# Patient Record
Sex: Male | Born: 1945 | Race: White | Hispanic: No | Smoking: Never smoker
Health system: Southern US, Community
[De-identification: ages and names within clinical notes are randomized; demographics above are authoritative.]

## PROBLEM LIST (undated history)

## (undated) DIAGNOSIS — E785 Hyperlipidemia, unspecified: Secondary | ICD-10-CM

## (undated) DIAGNOSIS — E039 Hypothyroidism, unspecified: Secondary | ICD-10-CM

## (undated) DIAGNOSIS — E0500 Thyrotoxicosis with diffuse goiter without thyrotoxic crisis or storm: Secondary | ICD-10-CM

## (undated) DIAGNOSIS — J45909 Unspecified asthma, uncomplicated: Secondary | ICD-10-CM

## (undated) DIAGNOSIS — K219 Gastro-esophageal reflux disease without esophagitis: Secondary | ICD-10-CM

## (undated) DIAGNOSIS — I10 Essential (primary) hypertension: Secondary | ICD-10-CM

## (undated) HISTORY — DX: Gastro-esophageal reflux disease without esophagitis: K21.9

## (undated) HISTORY — DX: Essential (primary) hypertension: I10

## (undated) HISTORY — DX: Unspecified asthma, uncomplicated: J45.909

## (undated) HISTORY — PX: CATARACT EXTRACTION: SUR2

## (undated) HISTORY — DX: Hyperlipidemia, unspecified: E78.5

## (undated) HISTORY — DX: Thyrotoxicosis with diffuse goiter without thyrotoxic crisis or storm: E05.00

---
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
## 2007-02-16 ENCOUNTER — Ambulatory Visit: Payer: Self-pay | Admitting: Cardiology

## 2007-02-23 ENCOUNTER — Ambulatory Visit (HOSPITAL_COMMUNITY): Admission: RE | Admit: 2007-02-23 | Discharge: 2007-02-23 | Payer: Self-pay | Admitting: Cardiology

## 2007-03-15 ENCOUNTER — Ambulatory Visit: Payer: Self-pay | Admitting: Cardiology

## 2007-06-03 ENCOUNTER — Ambulatory Visit: Payer: Self-pay | Admitting: Internal Medicine

## 2010-12-26 ENCOUNTER — Other Ambulatory Visit: Payer: Self-pay | Admitting: Otolaryngology

## 2010-12-26 DIAGNOSIS — R51 Headache: Secondary | ICD-10-CM

## 2010-12-27 ENCOUNTER — Ambulatory Visit
Admission: RE | Admit: 2010-12-27 | Discharge: 2010-12-27 | Disposition: A | Discharge status: Home / Self Care | Payer: MEDICARE | Source: Ambulatory Visit | Attending: Otolaryngology | Admitting: Otolaryngology

## 2010-12-27 DIAGNOSIS — R51 Headache: Secondary | ICD-10-CM

## 2010-12-27 MED ORDER — IOHEXOL 300 MG/ML  SOLN
75.0000 mL | Freq: Once | INTRAMUSCULAR | Status: AC | PRN
  Administered 2010-12-27: 75 mL via INTRAVENOUS

## 2011-03-25 NOTE — Assessment & Plan Note (Signed)
[facility]                             PULMONARY OFFICE NOTE   NAME:[name] Huff, [name] Huff                      MRN:          [id number]  DATE:06/03/2007                            DOB:          1946-04-01    PRIMARY CARE PHYSICIAN:  Dr. Brent Bulla.   PROBLEM LIST:  This gentleman seeks allergy evaluation because of  itching spots he associates with ant bites.   HISTORY:  This has been an issue over the past week and is now beginning  to improve as he treats it with an over-the-counter cream.  It is worse  in heat or direct sunlight.  Three weeks ago he says he was trying to  feed a cat in a loft and put his arm down where he saw some ants.  Apparently he got no more than a couple of ants on his left forearm but  he now points to isolated skin lesions which might be bites on his neck,  both arms, and legs.  He says initially there were little blisters,  especially at a place on the volar surface of his left elbow.  A similar  episode 2 years ago was treated with amoxicillin and prednisone.  He was  most concerned about one particular site on his left elbow where there  was a little serous drainage after he had scratched it.  He has been  using a hydrocortisone cream and a Benadryl cream.  Eyes and nose itch  some.  Remote history of allergy skin testing by Dr. Stevphen Rochester.   MEDICATIONS:  1. Lisinopril 20 mg.  2. Aspirin 81 mg.  3. Benadryl 2 daily.   No medication allergy.   REVIEW OF SYSTEMS:  Acid indigestion, sneezing, and itching.  A  chiropractor is using a vibrator for sinus congestion.  No wheeze or  cough.  No adenopathy.   PAST HISTORY:  1. Tonsillectomy.  2. Seasonal rhinitis.  3. He had a negative workup for chest pain ruling out MI in the past.  4. Elevated cholesterol.  5. No history of intolerance to latex, iodine, or aspirin.   SOCIAL HISTORY:  He is divorced.  No tobacco or alcohol.  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   FAMILY HISTORY:  Allergies.   OBJECTIVE:  VITAL SIGNS:  Weight  207 pounds, BP 136/78, pulse 78, room  air saturation 99%.  SKIN:  Isolated, small, nonspecific lesions consistent with insect  bites, possibly mosquitos, especially the ones on his arms and legs.  I  see no evidence of cellulitis.  There has been only limited excoriation.  I do not find adenopathy.  One lesion on the lateral aspect of his left  elbow was a little more indurated but still not impressive, about the  size of a quarter.  HEENT:  Oral mucosa is clear.  He has dentures.  Right tympanic membrane  is a little retracted.  CHEST:  Clear  HEART:  Sounds normal.   IMPRESSION:  1. Insect bites.  I cannot exclude a little bit of cellulitis/impetigo  associated with the one of his left elbow.  2. Eustachian dysfunction.  3. Seasonal allergic rhinitis.   PLAN:  1. Saline lavage.  2. Keflex prescription to hold 500 mg q.i.d. for 7 days.  3. Depo-Medrol 80 mg IM.  4. Return p.r.n.     Clinton D. Maple Hudson, MD, [facility], FACP  Electronically Signed    CDY/MedQ  DD: 06/05/2007  DT: 06/06/2007  Job #: [id number]

## 2011-03-28 NOTE — Consult Note (Signed)
[name] Huff, [name] Huff             ACCOUNT NO.:  [id number]   MEDICAL RECORD NO.:  [id number]          PATIENT TYPE:  OUT   LOCATION:  [facility]                         FACILITY:  [facility]   PHYSICIAN:  Noralyn Pick. Eden Emms, MD, FACCDATE OF BIRTH:  [birth year]   DATE OF CONSULTATION:  02/23/2007  DATE OF DISCHARGE:                                 CONSULTATION   EXERCISE TREADMILL TEST:  [name] Huff is a 65 year old patient of Dr.  Antoine Poche.  He has a history of hypertension.  Study was done to rule out  ischemia.  The patient is a school bus driver, apparently.   The patient exercised for 6 minutes and 30 seconds on the standard Bruce  protocol.  Exercise was stopped due to fatigue.  Maximum heart rate was  160.  Peak blood pressure was 195/93.  Resting EKG was normal.  With  stress, there was no ischemia.   IMPRESSION:  Negative exercise stress test with evidence for  deconditioning and a hypertensive response to exercise.   The patient's EKG was without evidence of ischemia or arrhythmia.   The patient will follow up with Dr. Antoine Poche in regards to possible  further treatment for his blood pressure.   He apparently is taking metoprolol 25 mg a day, but may benefit from the  addition of an ACE inhibitor.      Noralyn Pick. Eden Emms, MD, [facility]  Electronically Signed     PCN/MEDQ  D:  02/23/2007  T:  02/23/2007  Job:  [id number]

## 2011-03-28 NOTE — Assessment & Plan Note (Signed)
[facility]                            CARDIOLOGY OFFICE NOTE   NAME:[name] Huff, [name] Huff                      MRN:          [id number]  DATE:02/16/2007                            DOB:          [birth year]    PRIMARY CARE PHYSICIAN:  Dr. Brent Bulla.   REASON FOR PRESENTATION:  Evaluate patient with an abnormal EKG.   HISTORY OF PRESENT ILLNESS:  The patient is a very pleasant 65 year old  gentleman without prior cardiac history.  He recently saw Dr. Marina Goodell for  symptoms of sinus drainage and allergies.  He mentioned that he had been  having some chest discomfort.  Two to three days prior to this  presentation, he had been working in his yard.  He did a lot of heavy  lifting including posts and cutting a fallen tree.  With this, he did  not have any substernal chest pressure, neck or arm discomfort.  He may  have had a little nagging muscle soreness.  However, when he stopped  doing the work over the next couple of days, he noticed some chest  discomfort.  He points to his right substernal area.  It was mild.  It  was persistent over those 2-3 days.  There were no associated symptoms.  There was no nausea, vomiting or diaphoresis.  He never had anything  like this before.  He had no shortness of breath.  He has been  nauseated, but this has been a problem off and on and was not  particularly associated with the chest pain.  He had no excessive  diaphoresis with this discomfort, though he was sweating when he was  working hard.  He said the symptoms subsequently faded, and he has not  had them in about a week.  However, when he saw Dr. Marina Goodell, his EKG  demonstrated some inferior Q waves, suggestive of a possible old  inferior infarction.   The patient otherwise says he is doing well, except for the nausea which  bothers him.  He cannot reproducibly make himself have chest discomfort.  He does not have neck or arm discomfort with activity.  He has no  PND,  no orthopnea, no palpitations, no presyncope.   PAST MEDICAL HISTORY:  Hypertension x4 years.   PAST SURGICAL HISTORY:  Tonsillectomy.   ALLERGIES:  None.   CURRENT MEDICATIONS:  1. Tylenol.  2. Rhinocort.  3. Aspirin 81 mg daily.  4. Metoprolol 25 mg daily.  5. Loratadine.   SOCIAL HISTORY:  The patient used to smoke a few cigars but quit 30  years ago.  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  He is separated and has no children.   FAMILY HISTORY:  Noncontributory for early coronary artery disease.   REVIEW OF SYSTEMS:  As stated in the HPI.  Positive for occasional  headaches, wearing glasses, reflux, back discomfort with a protruding  disk, seasonal allergies.   PHYSICAL EXAMINATION:  GENERAL:  The patient is well appearing and in no  distress.  He is quite pleasant.  VITAL SIGNS:  Blood pressure  139/89, heart rate 76 and regular, weight  210 pounds, body mass index 32.  HEENT:  Eyelids unremarkable.  Pupils equal, round and reactive to  light.  Fundi within normal limits.  Oral mucosa unremarkable.  NECK:  No jugular venous distention at 45 degrees.  Carotid upstrokes  brisk and symmetric.  No bruits.  No thyromegaly.  LYMPHATICS:  No cervical, axillary or inguinal adenopathy.  LUNGS:  Clear to auscultation bilaterally.  BACK:  No costovertebral angle tenderness.  CHEST:  Unremarkable.  HEART:  PMI not displaced or sustained.  S1 and S2 within normal limits.  No S3, no S4, no clicks, no rubs, no murmurs.  ABDOMEN:  Obese.  Positive bowel sounds.  Normal in frequency and pitch.  No bruits, rebound or guarding.  No midline pulsatile mass.  No  hepatomegaly, no splenomegaly.  SKIN:  No rashes, no nodules.  EXTREMITIES:  There were 2+ pulses.  No edema, no cyanosis, no clubbing.  NEUROLOGIC:  Oriented to person, place, and time.  Cranial nerves II-XII  grossly intact.  Motor grossly intact.   ELECTROCARDIOGRAM:  Sinus rhythm, premature  atrial contractions.  Inferior Q waves not meeting criteria diagnostic of an inferior  myocardial infarction.  No acute ST-T wave changes.   ASSESSMENT AND PLAN:  1. The patient's chest discomfort is atypical.  His EKG does not      indicate a previous myocardial infarction.  He does have some risk      factors and is somewhat sedentary.  I think it is very reasonable      to screen him with an exercise treadmill test.  I think he has a      low pretest probability of obstructive coronary disease.  Most      importantly, we can risk stratify with this study, and also give      him a prescription for exercise.  The patient will come back to      have this done; the soonest we do it is in the hospital probably in      the next day or so.  If he has a normal exercise treadmill test, he      should be told that he can go back to driving a school bus.  He      wants to hold off until this study.  2. Obesity.  We discussed the need for weight loss.  He should do this      with diet and exercise.  I have recommended the [facility] Diet.  3. Hypertension.  Blood pressure seems to be controlled on the current      regimen, although the      ideal would be 120/70.  He may be able to accomplish this with      weight loss.  4. Follow up will be as needed, based on the results of his stress      test.     Rollene Rotunda, MD, [facility]  Electronically Signed    JH/MedQ  DD: 02/16/2007  DT: 02/17/2007  Job #: [id number]   cc:   Brent Bulla, M.D.

## 2011-03-28 NOTE — Assessment & Plan Note (Signed)
[facility]                            CARDIOLOGY OFFICE NOTE   NAME:[name], Huff                      MRN:          [id number]  DATE:03/15/2007                            DOB:          03/01/1946    PRIMARY CARE PHYSICIAN:  Dr. Brent Bulla.   REASON FOR PRESENTATION:  Evaluate patient with an abnormal EKG.   HISTORY OF PRESENT ILLNESS:  Patient presents for followup.  He had some  chest discomfort and slightly abnormal EKG in the past.  I sent him for  an exercise treadmill test.  He exercised for 6 minutes, 30 seconds, and  had no evidence of ischemia.  He did have an accelerated blood pressure  response.  He has been treated now with an ACE inhibitor.  He actually  had his beta blocker discontinued because of brady arrhythmia.  He has  had no new symptoms.  He denies any new chest pressure, neck or arm  discomfort.  He has had no shortness of breath.  He has had no  palpitations, presyncope, or syncope.  He has had no PND or orthopnea.  He has had some very mild edema.   PAST MEDICAL HISTORY:  Hypertension, obesity, tonsillectomy.   ALLERGIES:  None.   MEDICATIONS:  1. Rhinocort.  2. Lisinopril 20 mg daily.  3. Zyrtec.   REVIEW OF SYSTEMS:  As stated in the HPI, otherwise negative for other  systems.   PHYSICAL EXAMINATION:  GENERAL:  Patient is in no distress.  VITAL SIGNS:  Blood pressure 128/90, heart rate 210.  Body Mass Index  36.  NECK:  No jugular venous distention at 45 degrees.  Carotid upstroke  brisk and symmetric.  No bruits, no thyromegaly.  LYMPHATICS:  No adenopathy.  LUNGS:  Clear to auscultation bilaterally.  BACK:  No costovertebral angle tenderness.  CHEST:  Unremarkable.  HEART:  PMI not displaced or sustained.  S1 and S2 within normal limits.  No S3, no S4, no clicks, rubs, or murmurs.  ABDOMEN:  Obese, positive bowel sounds.  Normal in frequency and pitch.  No bruits, rebound, guarding.  There are no  midline pulsatile masses.  No organomegaly.  SKIN:  No rashes, no nodules.  EXTREMITIES:  Pulses 2+.  Trace bilateral lower extremity edema.  Mild  dependent rubor.  NEUROLOGIC:  Grossly intact.   ASSESSMENT/PLAN:  1. Hypertension:  Patient's blood pressure is elevated with exercise;      however, I think he is on a good medical regimen.  I would      prescribe weight loss as a therapeutic lifestyle change (TLC).  We      discussed this at length.  I prescribed the [facility] Diet.  2. Obesity:  As above.  3. Edema:  The patient has some very mild lower extremity edema.  I do      not think there is a cardiac etiology to this.  This is probably      related to lifestyle and weight.  We discussed salt restriction,      keeping his feet elevated, and weight loss.  FOLLOW UP:  As needed.     Rollene Rotunda, MD, [facility]     JH/MedQ  DD: 03/15/2007  DT: 03/16/2007  Job #: [id number]   cc:   Dr. Brent Bulla                             [facility]                            CARDIOLOGY OFFICE NOTE   NAME:Stapel, [name] Huff                      MRN:          [id number]  DATE:03/15/2007                            DOB:          December 31, 1945    PRIMARY CARE PHYSICIAN:  Dr. Brent Bulla.   REASON FOR PRESENTATION:  Evaluate patient with an abnormal EKG.   HISTORY OF PRESENT ILLNESS:  Patient presents for followup.  He had some  chest discomfort and slightly abnormal EKG in the past.  I sent him for  an exercise treadmill test.  He exercised for 6 minutes, 30 seconds, and  had no evidence of ischemia.  He did have an accelerated blood pressure  response.  He has been treated now with an ACE inhibitor.  He actually  had his beta blocker discontinued because of brady arrhythmia.  He has  had no new symptoms.  He denies any new chest pressure, neck or arm  discomfort.  He has had no shortness of breath.  He has had no  palpitations, presyncope, or syncope.  He has had no PND or  orthopnea.  He has had some very mild edema.   PAST MEDICAL HISTORY:  Hypertension, obesity, tonsillectomy.   ALLERGIES:  None.   MEDICATIONS:  1. Rhinocort.  2. Lisinopril 20 mg daily.  3. Zyrtec.   REVIEW OF SYSTEMS:  As stated in the HPI, otherwise negative for other  systems.   PHYSICAL EXAMINATION:  GENERAL:  Patient is in no distress.  VITAL SIGNS:  Blood pressure 128/90, heart rate 210.  Body Mass Index  36.  NECK:  No jugular venous distention at 45 degrees.  Carotid upstroke  brisk and symmetric.  No bruits, no thyromegaly.  LYMPHATICS:  No adenopathy.  LUNGS:  Clear to auscultation bilaterally.  BACK:  No costovertebral angle tenderness.  CHEST:  Unremarkable.  HEART:  PMI not displaced or sustained.  S1 and S2 within normal limits.  No S3, no S4, no clicks, rubs, or murmurs.  ABDOMEN:  Obese, positive bowel sounds.  Normal in frequency and pitch.  No bruits, rebound, guarding.  There are no midline pulsatile masses.  No organomegaly.  SKIN:  No rashes, no nodules.  EXTREMITIES:  Pulses 2+.  Trace bilateral lower extremity edema.  Mild  dependent rubor.  NEUROLOGIC:  Grossly intact.   ASSESSMENT/PLAN:  1. Hypertension:  Patient's blood pressure is elevated with exercise;      however, I think he is on a good medical regimen.  I would      prescribe weight loss as a therapeutic lifestyle change (TLC).  We      discussed this at length.  I prescribed the [facility] Diet.  2. Obesity:  As above.

## 2012-07-25 IMAGING — CT CT HEAD WO/W CM
1 of 2 series · 13 of 30 positions shown, 17 images · IV contrast (agent unspecified)
Comparison: None.

CLINICAL DATA: Headaches.  Dizziness.  Occasional blurred vision.

CT HEAD WITHOUT AND WITH CONTRAST
TECHNIQUE: Contiguous axial images were obtained from the base of
the skull through the vertex without and with intravenous contrast.
Contrast: 75 ml [id number]

[Series 32: 3d filtered head w/o · axial · non-contrast · 0.49mm/px · z∈[+35,+159]mm · 13 of 28 slices shown, 17 images]
[im 2/28  brain]
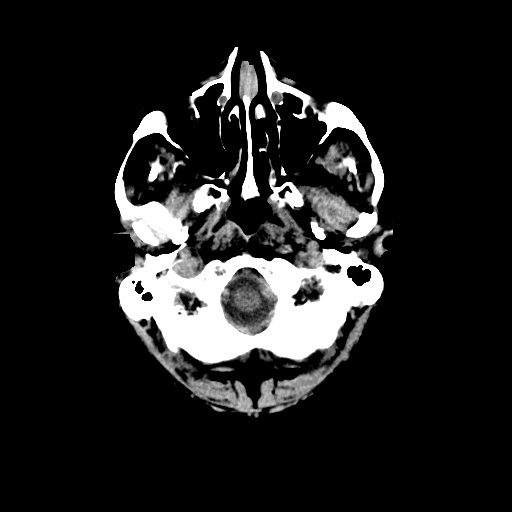
[im 2/28  bone]
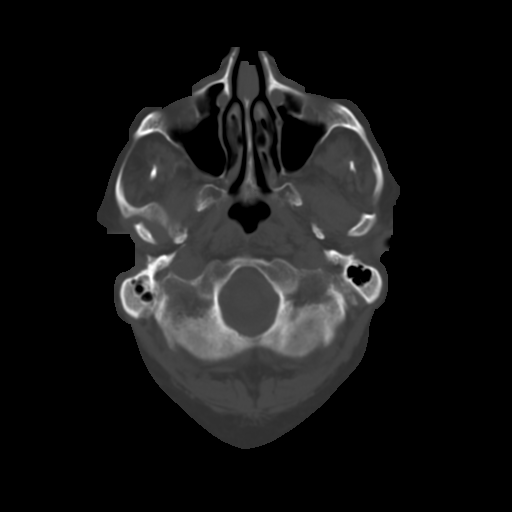
[im 4/28  brain]
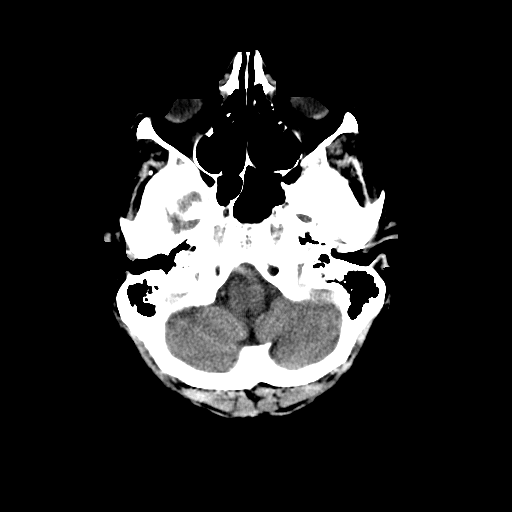
[im 6/28  brain]
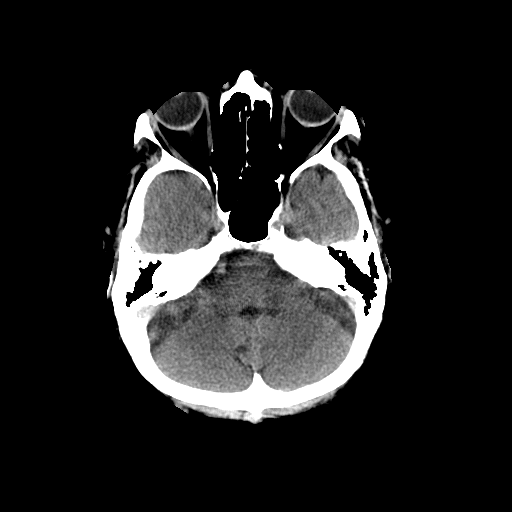
[im 8/28  brain]
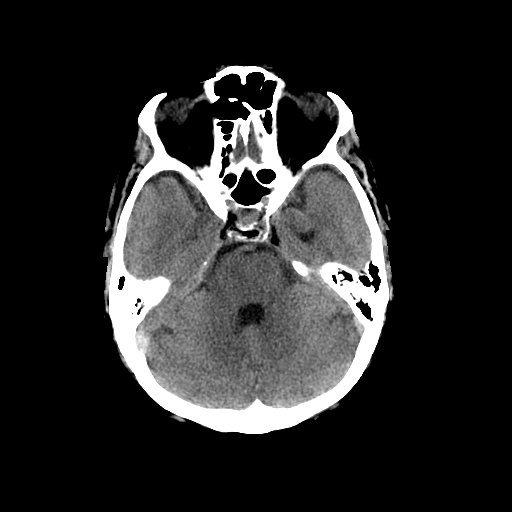
[im 10/28  brain]
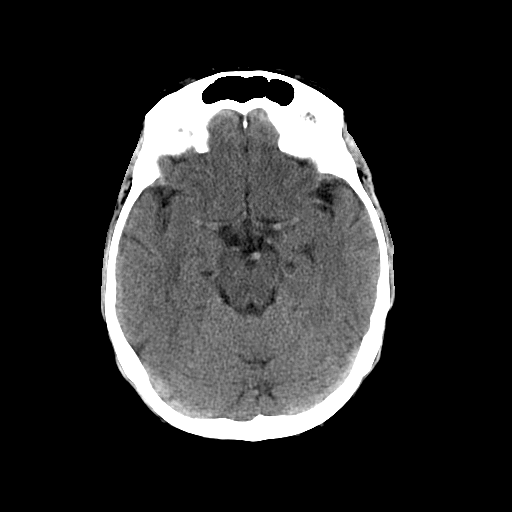
[im 10/28  bone]
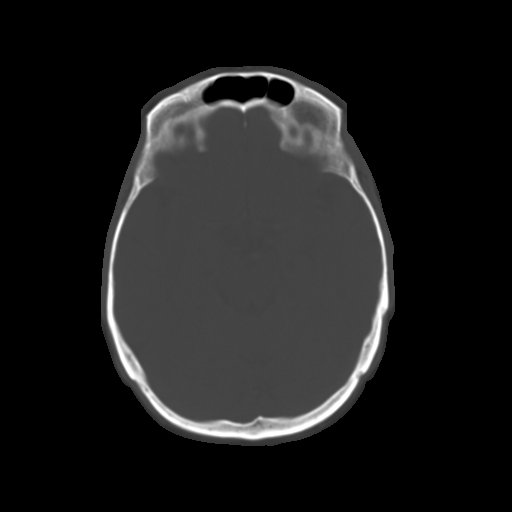
[im 12/28  brain]
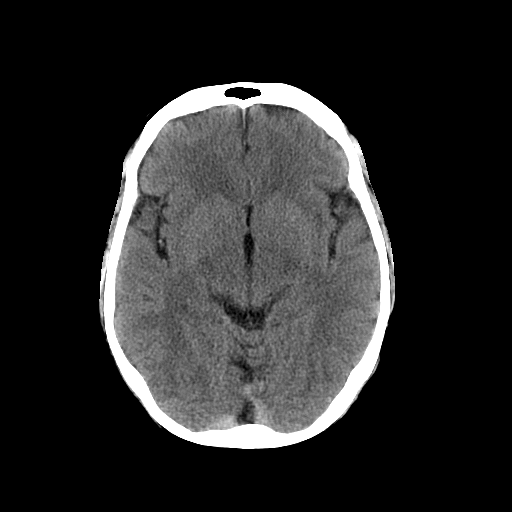
[im 14/28  brain]
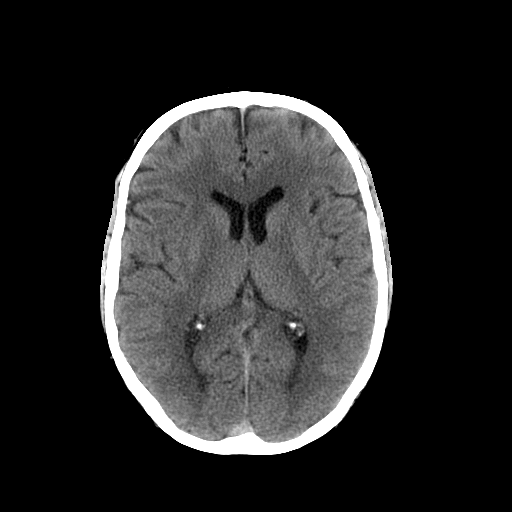
[im 16/28  brain]
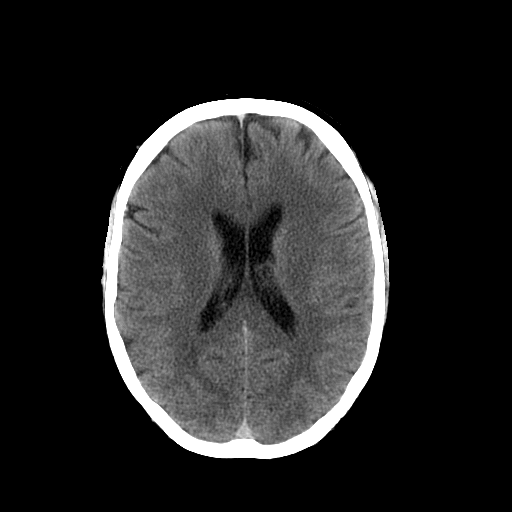
[im 18/28  brain]
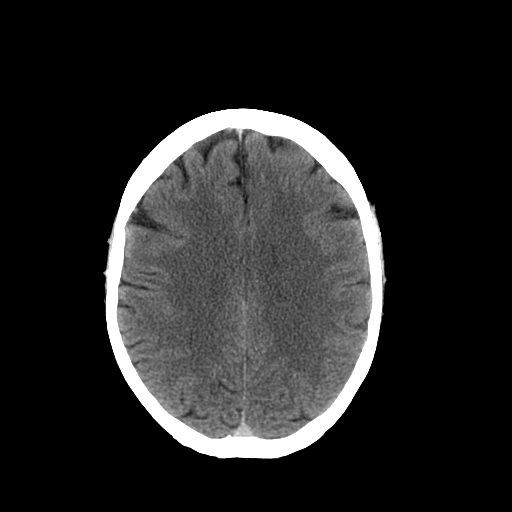
[im 18/28  bone]
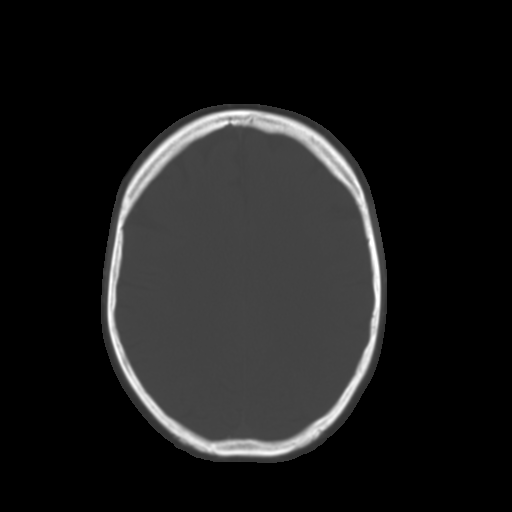
[im 20/28  brain]
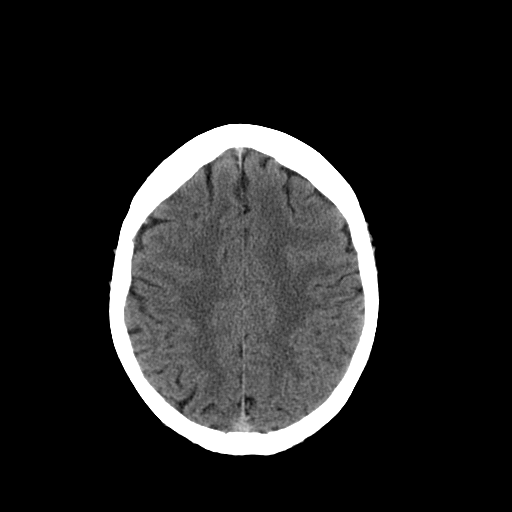
[im 22/28  brain]
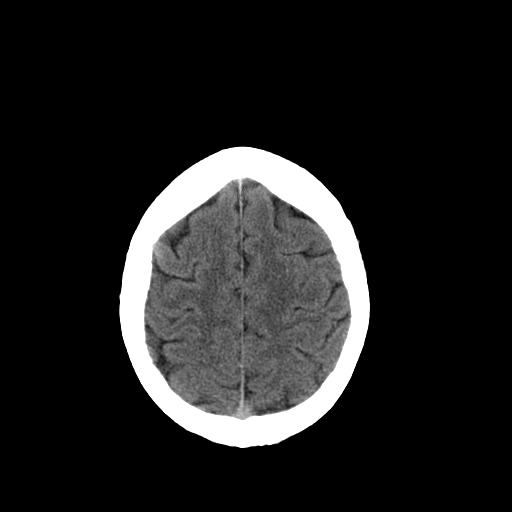
[im 24/28  brain]
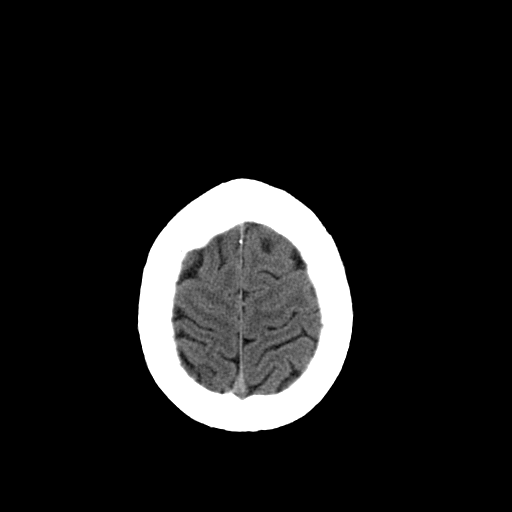
[im 26/28  brain]
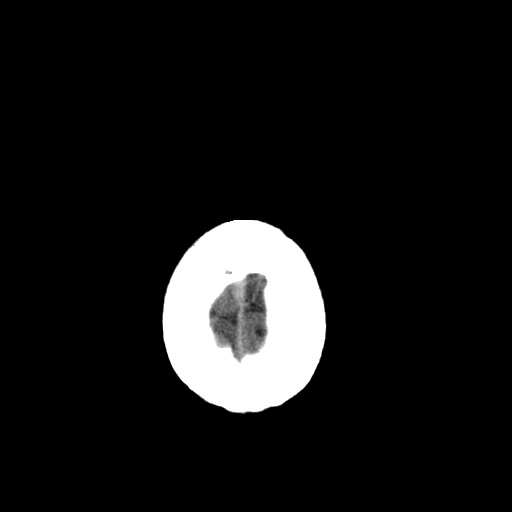
[im 26/28  bone]
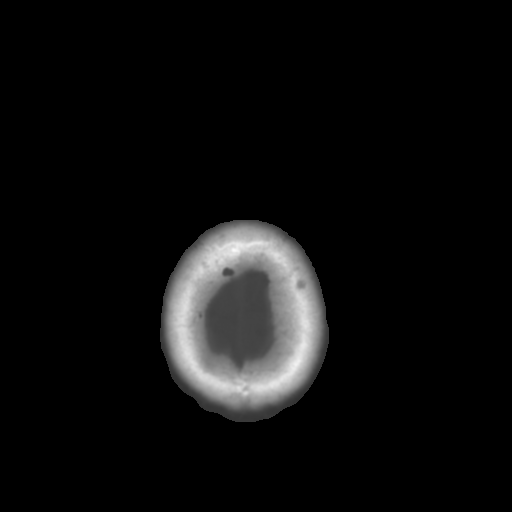

[13 of 30 positions shown; findings below may reference images not displayed]

FINDINGS: Normal appearing cerebral hemispheres and posterior fossa
structures.  Normal size and position of the ventricles.  No
intracranial hemorrhage, mass lesion, abnormal enhancement or CT
evidence of acute infarction.  Normal appearing bones and included
paranasal sinuses.
IMPRESSION: Normal examination.

## 2014-11-22 DIAGNOSIS — Z024 Encounter for examination for driving license: Secondary | ICD-10-CM | POA: Diagnosis not present

## 2014-11-22 DIAGNOSIS — Z6832 Body mass index (BMI) 32.0-32.9, adult: Secondary | ICD-10-CM | POA: Diagnosis not present

## 2014-11-27 DIAGNOSIS — H2513 Age-related nuclear cataract, bilateral: Secondary | ICD-10-CM | POA: Diagnosis not present

## 2014-11-27 DIAGNOSIS — H1045 Other chronic allergic conjunctivitis: Secondary | ICD-10-CM | POA: Diagnosis not present

## 2014-12-04 DIAGNOSIS — J45909 Unspecified asthma, uncomplicated: Secondary | ICD-10-CM | POA: Diagnosis not present

## 2014-12-04 DIAGNOSIS — I10 Essential (primary) hypertension: Secondary | ICD-10-CM | POA: Diagnosis not present

## 2014-12-04 DIAGNOSIS — Z6832 Body mass index (BMI) 32.0-32.9, adult: Secondary | ICD-10-CM | POA: Diagnosis not present

## 2014-12-04 DIAGNOSIS — E039 Hypothyroidism, unspecified: Secondary | ICD-10-CM | POA: Diagnosis not present

## 2014-12-04 DIAGNOSIS — R7309 Other abnormal glucose: Secondary | ICD-10-CM | POA: Diagnosis not present

## 2014-12-05 DIAGNOSIS — E035 Myxedema coma: Secondary | ICD-10-CM | POA: Diagnosis not present

## 2014-12-05 DIAGNOSIS — R7309 Other abnormal glucose: Secondary | ICD-10-CM | POA: Diagnosis not present

## 2014-12-05 DIAGNOSIS — E785 Hyperlipidemia, unspecified: Secondary | ICD-10-CM | POA: Diagnosis not present

## 2014-12-05 DIAGNOSIS — E039 Hypothyroidism, unspecified: Secondary | ICD-10-CM | POA: Diagnosis not present

## 2014-12-05 DIAGNOSIS — I10 Essential (primary) hypertension: Secondary | ICD-10-CM | POA: Diagnosis not present

## 2015-01-05 DIAGNOSIS — Z6832 Body mass index (BMI) 32.0-32.9, adult: Secondary | ICD-10-CM | POA: Diagnosis not present

## 2015-01-05 DIAGNOSIS — J180 Bronchopneumonia, unspecified organism: Secondary | ICD-10-CM | POA: Diagnosis not present

## 2015-01-20 DIAGNOSIS — J45901 Unspecified asthma with (acute) exacerbation: Secondary | ICD-10-CM | POA: Diagnosis not present

## 2015-01-20 DIAGNOSIS — Z6832 Body mass index (BMI) 32.0-32.9, adult: Secondary | ICD-10-CM | POA: Diagnosis not present

## 2015-04-24 DIAGNOSIS — Z6832 Body mass index (BMI) 32.0-32.9, adult: Secondary | ICD-10-CM | POA: Diagnosis not present

## 2015-04-24 DIAGNOSIS — Z0000 Encounter for general adult medical examination without abnormal findings: Secondary | ICD-10-CM | POA: Diagnosis not present

## 2015-04-27 DIAGNOSIS — H25811 Combined forms of age-related cataract, right eye: Secondary | ICD-10-CM | POA: Diagnosis not present

## 2015-05-07 DIAGNOSIS — Z1211 Encounter for screening for malignant neoplasm of colon: Secondary | ICD-10-CM | POA: Diagnosis not present

## 2015-05-08 DIAGNOSIS — E039 Hypothyroidism, unspecified: Secondary | ICD-10-CM | POA: Diagnosis not present

## 2015-05-08 DIAGNOSIS — E785 Hyperlipidemia, unspecified: Secondary | ICD-10-CM | POA: Diagnosis not present

## 2015-05-08 DIAGNOSIS — H25811 Combined forms of age-related cataract, right eye: Secondary | ICD-10-CM | POA: Diagnosis not present

## 2015-05-08 DIAGNOSIS — H259 Unspecified age-related cataract: Secondary | ICD-10-CM | POA: Diagnosis not present

## 2015-05-08 DIAGNOSIS — I10 Essential (primary) hypertension: Secondary | ICD-10-CM | POA: Diagnosis not present

## 2015-05-23 DIAGNOSIS — J45902 Unspecified asthma with status asthmaticus: Secondary | ICD-10-CM | POA: Diagnosis not present

## 2015-05-23 DIAGNOSIS — Z6832 Body mass index (BMI) 32.0-32.9, adult: Secondary | ICD-10-CM | POA: Diagnosis not present

## 2015-06-06 DIAGNOSIS — R7309 Other abnormal glucose: Secondary | ICD-10-CM | POA: Diagnosis not present

## 2015-06-06 DIAGNOSIS — N390 Urinary tract infection, site not specified: Secondary | ICD-10-CM | POA: Diagnosis not present

## 2015-06-06 DIAGNOSIS — Z6832 Body mass index (BMI) 32.0-32.9, adult: Secondary | ICD-10-CM | POA: Diagnosis not present

## 2015-06-06 DIAGNOSIS — J45909 Unspecified asthma, uncomplicated: Secondary | ICD-10-CM | POA: Diagnosis not present

## 2015-06-06 DIAGNOSIS — I10 Essential (primary) hypertension: Secondary | ICD-10-CM | POA: Diagnosis not present

## 2015-06-06 DIAGNOSIS — E039 Hypothyroidism, unspecified: Secondary | ICD-10-CM | POA: Diagnosis not present

## 2015-06-19 DIAGNOSIS — E039 Hypothyroidism, unspecified: Secondary | ICD-10-CM | POA: Diagnosis not present

## 2015-06-19 DIAGNOSIS — Z79899 Other long term (current) drug therapy: Secondary | ICD-10-CM | POA: Diagnosis not present

## 2015-06-19 DIAGNOSIS — H269 Unspecified cataract: Secondary | ICD-10-CM | POA: Diagnosis not present

## 2015-06-19 DIAGNOSIS — J45909 Unspecified asthma, uncomplicated: Secondary | ICD-10-CM | POA: Diagnosis not present

## 2015-06-19 DIAGNOSIS — I10 Essential (primary) hypertension: Secondary | ICD-10-CM | POA: Diagnosis not present

## 2015-06-19 DIAGNOSIS — H25812 Combined forms of age-related cataract, left eye: Secondary | ICD-10-CM | POA: Diagnosis not present

## 2015-07-20 DIAGNOSIS — Z961 Presence of intraocular lens: Secondary | ICD-10-CM | POA: Diagnosis not present

## 2015-10-31 DIAGNOSIS — Z6832 Body mass index (BMI) 32.0-32.9, adult: Secondary | ICD-10-CM | POA: Diagnosis not present

## 2015-10-31 DIAGNOSIS — J45901 Unspecified asthma with (acute) exacerbation: Secondary | ICD-10-CM | POA: Diagnosis not present

## 2015-11-12 DIAGNOSIS — J0100 Acute maxillary sinusitis, unspecified: Secondary | ICD-10-CM | POA: Diagnosis not present

## 2015-11-30 DIAGNOSIS — Z23 Encounter for immunization: Secondary | ICD-10-CM | POA: Diagnosis not present

## 2015-12-11 DIAGNOSIS — E785 Hyperlipidemia, unspecified: Secondary | ICD-10-CM | POA: Diagnosis not present

## 2015-12-11 DIAGNOSIS — R7309 Other abnormal glucose: Secondary | ICD-10-CM | POA: Diagnosis not present

## 2015-12-11 DIAGNOSIS — I10 Essential (primary) hypertension: Secondary | ICD-10-CM | POA: Diagnosis not present

## 2015-12-11 DIAGNOSIS — Z6832 Body mass index (BMI) 32.0-32.9, adult: Secondary | ICD-10-CM | POA: Diagnosis not present

## 2015-12-11 DIAGNOSIS — E039 Hypothyroidism, unspecified: Secondary | ICD-10-CM | POA: Diagnosis not present

## 2015-12-19 DIAGNOSIS — J45902 Unspecified asthma with status asthmaticus: Secondary | ICD-10-CM | POA: Diagnosis not present

## 2015-12-24 DIAGNOSIS — R6889 Other general symptoms and signs: Secondary | ICD-10-CM | POA: Diagnosis not present

## 2016-01-03 DIAGNOSIS — J45902 Unspecified asthma with status asthmaticus: Secondary | ICD-10-CM | POA: Diagnosis not present

## 2016-02-03 DIAGNOSIS — J069 Acute upper respiratory infection, unspecified: Secondary | ICD-10-CM | POA: Diagnosis not present

## 2016-02-18 DIAGNOSIS — R6889 Other general symptoms and signs: Secondary | ICD-10-CM | POA: Diagnosis not present

## 2016-03-31 DIAGNOSIS — H1045 Other chronic allergic conjunctivitis: Secondary | ICD-10-CM | POA: Diagnosis not present

## 2016-03-31 DIAGNOSIS — H01009 Unspecified blepharitis unspecified eye, unspecified eyelid: Secondary | ICD-10-CM | POA: Diagnosis not present

## 2016-05-20 DIAGNOSIS — R6889 Other general symptoms and signs: Secondary | ICD-10-CM | POA: Diagnosis not present

## 2016-05-28 DIAGNOSIS — R6889 Other general symptoms and signs: Secondary | ICD-10-CM | POA: Diagnosis not present

## 2016-06-04 DIAGNOSIS — H26493 Other secondary cataract, bilateral: Secondary | ICD-10-CM | POA: Diagnosis not present

## 2016-06-04 DIAGNOSIS — H04123 Dry eye syndrome of bilateral lacrimal glands: Secondary | ICD-10-CM | POA: Diagnosis not present

## 2016-06-11 DIAGNOSIS — I454 Nonspecific intraventricular block: Secondary | ICD-10-CM | POA: Diagnosis not present

## 2016-06-11 DIAGNOSIS — I10 Essential (primary) hypertension: Secondary | ICD-10-CM | POA: Diagnosis not present

## 2016-06-11 DIAGNOSIS — R7309 Other abnormal glucose: Secondary | ICD-10-CM | POA: Diagnosis not present

## 2016-06-11 DIAGNOSIS — E039 Hypothyroidism, unspecified: Secondary | ICD-10-CM | POA: Diagnosis not present

## 2016-06-11 DIAGNOSIS — Z6832 Body mass index (BMI) 32.0-32.9, adult: Secondary | ICD-10-CM | POA: Diagnosis not present

## 2016-06-11 DIAGNOSIS — J45909 Unspecified asthma, uncomplicated: Secondary | ICD-10-CM | POA: Diagnosis not present

## 2016-06-20 DIAGNOSIS — J45901 Unspecified asthma with (acute) exacerbation: Secondary | ICD-10-CM | POA: Diagnosis not present

## 2016-06-24 DIAGNOSIS — R6889 Other general symptoms and signs: Secondary | ICD-10-CM | POA: Diagnosis not present

## 2016-07-05 DIAGNOSIS — J019 Acute sinusitis, unspecified: Secondary | ICD-10-CM | POA: Diagnosis not present

## 2016-07-26 DIAGNOSIS — J40 Bronchitis, not specified as acute or chronic: Secondary | ICD-10-CM | POA: Diagnosis not present

## 2016-08-08 DIAGNOSIS — J180 Bronchopneumonia, unspecified organism: Secondary | ICD-10-CM | POA: Diagnosis not present

## 2016-09-11 DIAGNOSIS — Z23 Encounter for immunization: Secondary | ICD-10-CM | POA: Diagnosis not present

## 2016-10-01 DIAGNOSIS — Z0000 Encounter for general adult medical examination without abnormal findings: Secondary | ICD-10-CM | POA: Diagnosis not present

## 2017-06-02 DIAGNOSIS — E038 Other specified hypothyroidism: Secondary | ICD-10-CM | POA: Diagnosis not present

## 2017-06-02 DIAGNOSIS — K219 Gastro-esophageal reflux disease without esophagitis: Secondary | ICD-10-CM | POA: Diagnosis not present

## 2017-06-02 DIAGNOSIS — J4520 Mild intermittent asthma, uncomplicated: Secondary | ICD-10-CM | POA: Diagnosis not present

## 2017-06-02 DIAGNOSIS — I10 Essential (primary) hypertension: Secondary | ICD-10-CM | POA: Diagnosis not present

## 2017-06-02 DIAGNOSIS — E782 Mixed hyperlipidemia: Secondary | ICD-10-CM | POA: Diagnosis not present

## 2017-06-10 DIAGNOSIS — H26493 Other secondary cataract, bilateral: Secondary | ICD-10-CM | POA: Diagnosis not present

## 2017-09-15 DIAGNOSIS — J45901 Unspecified asthma with (acute) exacerbation: Secondary | ICD-10-CM | POA: Diagnosis not present

## 2017-09-15 DIAGNOSIS — M545 Low back pain: Secondary | ICD-10-CM | POA: Diagnosis not present

## 2017-10-20 DIAGNOSIS — J189 Pneumonia, unspecified organism: Secondary | ICD-10-CM | POA: Diagnosis not present

## 2017-11-09 DIAGNOSIS — J208 Acute bronchitis due to other specified organisms: Secondary | ICD-10-CM | POA: Diagnosis not present

## 2017-11-30 DIAGNOSIS — I10 Essential (primary) hypertension: Secondary | ICD-10-CM | POA: Diagnosis not present

## 2017-11-30 DIAGNOSIS — J4520 Mild intermittent asthma, uncomplicated: Secondary | ICD-10-CM | POA: Diagnosis not present

## 2017-11-30 DIAGNOSIS — K219 Gastro-esophageal reflux disease without esophagitis: Secondary | ICD-10-CM | POA: Diagnosis not present

## 2017-11-30 DIAGNOSIS — E038 Other specified hypothyroidism: Secondary | ICD-10-CM | POA: Diagnosis not present

## 2017-11-30 DIAGNOSIS — E782 Mixed hyperlipidemia: Secondary | ICD-10-CM | POA: Diagnosis not present

## 2018-02-22 DIAGNOSIS — J4521 Mild intermittent asthma with (acute) exacerbation: Secondary | ICD-10-CM | POA: Diagnosis not present

## 2018-02-22 DIAGNOSIS — J018 Other acute sinusitis: Secondary | ICD-10-CM | POA: Diagnosis not present

## 2018-04-14 DIAGNOSIS — K5732 Diverticulitis of large intestine without perforation or abscess without bleeding: Secondary | ICD-10-CM | POA: Diagnosis not present

## 2018-05-03 DIAGNOSIS — Z008 Encounter for other general examination: Secondary | ICD-10-CM | POA: Diagnosis not present

## 2018-05-07 DIAGNOSIS — J4521 Mild intermittent asthma with (acute) exacerbation: Secondary | ICD-10-CM | POA: Diagnosis not present

## 2018-05-27 DIAGNOSIS — H26493 Other secondary cataract, bilateral: Secondary | ICD-10-CM | POA: Diagnosis not present

## 2018-05-31 DIAGNOSIS — E782 Mixed hyperlipidemia: Secondary | ICD-10-CM | POA: Diagnosis not present

## 2018-05-31 DIAGNOSIS — I10 Essential (primary) hypertension: Secondary | ICD-10-CM | POA: Diagnosis not present

## 2018-05-31 DIAGNOSIS — E038 Other specified hypothyroidism: Secondary | ICD-10-CM | POA: Diagnosis not present

## 2018-05-31 DIAGNOSIS — J4520 Mild intermittent asthma, uncomplicated: Secondary | ICD-10-CM | POA: Diagnosis not present

## 2018-05-31 DIAGNOSIS — K219 Gastro-esophageal reflux disease without esophagitis: Secondary | ICD-10-CM | POA: Diagnosis not present

## 2018-06-02 DIAGNOSIS — H26493 Other secondary cataract, bilateral: Secondary | ICD-10-CM | POA: Diagnosis not present

## 2018-06-14 DIAGNOSIS — H43391 Other vitreous opacities, right eye: Secondary | ICD-10-CM | POA: Diagnosis not present

## 2018-07-13 DIAGNOSIS — E038 Other specified hypothyroidism: Secondary | ICD-10-CM | POA: Diagnosis not present

## 2018-09-10 DIAGNOSIS — E038 Other specified hypothyroidism: Secondary | ICD-10-CM | POA: Diagnosis not present

## 2018-09-22 DIAGNOSIS — R062 Wheezing: Secondary | ICD-10-CM | POA: Diagnosis not present

## 2018-09-22 DIAGNOSIS — J018 Other acute sinusitis: Secondary | ICD-10-CM | POA: Diagnosis not present

## 2018-09-22 DIAGNOSIS — R1084 Generalized abdominal pain: Secondary | ICD-10-CM | POA: Diagnosis not present

## 2018-10-06 DIAGNOSIS — Z23 Encounter for immunization: Secondary | ICD-10-CM | POA: Diagnosis not present

## 2018-11-01 DIAGNOSIS — E038 Other specified hypothyroidism: Secondary | ICD-10-CM | POA: Diagnosis not present

## 2018-11-10 HISTORY — PX: TONSILECTOMY/ADENOIDECTOMY WITH MYRINGOTOMY: SHX6125

## 2018-11-29 DIAGNOSIS — I10 Essential (primary) hypertension: Secondary | ICD-10-CM | POA: Diagnosis not present

## 2018-11-29 DIAGNOSIS — J4520 Mild intermittent asthma, uncomplicated: Secondary | ICD-10-CM | POA: Diagnosis not present

## 2018-11-29 DIAGNOSIS — E038 Other specified hypothyroidism: Secondary | ICD-10-CM | POA: Diagnosis not present

## 2018-11-29 DIAGNOSIS — E782 Mixed hyperlipidemia: Secondary | ICD-10-CM | POA: Diagnosis not present

## 2019-01-12 DIAGNOSIS — E032 Hypothyroidism due to medicaments and other exogenous substances: Secondary | ICD-10-CM | POA: Diagnosis not present

## 2019-01-12 DIAGNOSIS — E038 Other specified hypothyroidism: Secondary | ICD-10-CM | POA: Diagnosis not present

## 2019-03-03 DIAGNOSIS — J0100 Acute maxillary sinusitis, unspecified: Secondary | ICD-10-CM | POA: Diagnosis not present

## 2019-03-03 DIAGNOSIS — Z1159 Encounter for screening for other viral diseases: Secondary | ICD-10-CM | POA: Diagnosis not present

## 2019-04-18 DIAGNOSIS — H1045 Other chronic allergic conjunctivitis: Secondary | ICD-10-CM | POA: Diagnosis not present

## 2019-05-09 DIAGNOSIS — J0100 Acute maxillary sinusitis, unspecified: Secondary | ICD-10-CM | POA: Diagnosis not present

## 2019-05-09 DIAGNOSIS — J4521 Mild intermittent asthma with (acute) exacerbation: Secondary | ICD-10-CM | POA: Diagnosis not present

## 2019-05-12 ENCOUNTER — Other Ambulatory Visit: Payer: Self-pay

## 2019-05-16 ENCOUNTER — Other Ambulatory Visit: Payer: Self-pay

## 2019-05-16 ENCOUNTER — Ambulatory Visit (INDEPENDENT_AMBULATORY_CARE_PROVIDER_SITE_OTHER): Payer: Medicare Other | Admitting: Internal Medicine

## 2019-05-16 ENCOUNTER — Encounter: Payer: Self-pay | Admitting: Internal Medicine

## 2019-05-16 VITALS — BP 124/78 | HR 100 | Temp 97.9°F | Ht 67.0 in | Wt 195.6 lb

## 2019-05-16 DIAGNOSIS — R7989 Other specified abnormal findings of blood chemistry: Secondary | ICD-10-CM

## 2019-05-16 DIAGNOSIS — I10 Essential (primary) hypertension: Secondary | ICD-10-CM

## 2019-05-16 DIAGNOSIS — E0590 Thyrotoxicosis, unspecified without thyrotoxic crisis or storm: Secondary | ICD-10-CM | POA: Diagnosis not present

## 2019-05-16 LAB — T4, FREE: Free T4: 1.45 ng/dL (ref 0.60–1.60)

## 2019-05-16 LAB — TSH: TSH: <0.01 u[IU]/mL — ABNORMAL LOW (ref 0.35–4.50)

## 2019-05-16 NOTE — Patient Instructions (Signed)
-   Please stop by the lab today, we will contact you with the results.

## 2019-05-16 NOTE — Progress Notes (Signed)
Name: [name] Huff  MRN/ DOB: [id number], 12/11/1945    Age/ Sex: 73 y.o., male    PCP: [name] Anes, MD   Reason for Endocrinology Evaluation: Low TSH      Date of Initial Endocrinology Evaluation: 05/17/2019     HPI: [name] is a 73 y.o. male with a past medical history of HTN and Hx of hypothyroidism. The patient presented for initial endocrinology clinic visit on 05/17/2019 for consultative assistance with his low TSH   Has been diagnosed with hypothyroidism many years ago, he was  on LT-4 replacement for many years , he was on up to 150 mcg daily  but in 2019 his dose has been gradually reduced and now has been off for a few months.    He has gained 2 lbs since being off levothyroxine, with palpitations and weakness.  Denies anxiety or jittery sensation  Denies tremors , constipation or diarrhea.    No biotin use  No local neck symotoms   Sister with thyroid disease     HISTORY:  Past Medical History:  Past Medical History:  Diagnosis Date  . Hypertension   . Hypothyroidism     Past Surgical History: Cataract extraction   Social History:  reports that he has quit smoking. He has never used smokeless tobacco. He reports that he does not drink alcohol.  Family History: family history includes Goiter in his paternal grandmother; Thyroid disease in his sister.   HOME MEDICATIONS: Allergies as of 05/16/2019   No Known Allergies     Medication List       Accurate as of May 16, 2019 11:59 PM. If you have any questions, ask your nurse or doctor.        albuterol 108 (90 Base) MCG/ACT inhaler Commonly known as: VENTOLIN HFA   cetirizine 10 MG tablet Commonly known as: ZYRTEC Take 10 mg by mouth daily.   lisinopril 20 MG tablet Commonly known as: ZESTRIL   methimazole 10 MG tablet Commonly known as: TAPAZOLE Take 1 tablet (10 mg total) by mouth daily. Started by: [name] Sciara, MD   omeprazole 40 MG capsule  Commonly known as: PRILOSEC         REVIEW OF SYSTEMS: A comprehensive ROS was conducted with the patient and is negative except as per HPI and below:  Review of Systems  Respiratory: Negative for cough and shortness of breath.   Cardiovascular: Positive for palpitations. Negative for chest pain.  Gastrointestinal: Negative for constipation and diarrhea.  Neurological: Negative for tingling and tremors.  Psychiatric/Behavioral: Negative for depression. The patient is not nervous/anxious.        OBJECTIVE:  VS: BP 124/78 (BP Location: Left Arm, Patient Position: Sitting, Cuff Size: Normal)   Pulse 100   Temp 97.9 F (36.6 C)   Ht '5\' 7"'$  (1.702 m)   Wt 195 lb 9.6 oz (88.7 kg)   SpO2 97%   BMI 30.64 kg/m    Wt Readings from Last 3 Encounters:  05/16/19 195 lb 9.6 oz (88.7 kg)     EXAM: General: Pt appears well and is in NAD  Hydration: Well-hydrated with moist mucous membranes and good skin turgor  Eyes: External eye exam normal without stare, lid lag or exophthalmos.  EOM intact.  PERRL.  Ears, Nose, Throat: Hearing: Grossly intact bilaterally Dental: Good dentition  Throat: Clear without mass, erythema or exudate  Neck: General: Supple without adenopathy. Thyroid: Thyroid size normal.  No goiter  or nodules appreciated. No thyroid bruit.  Lungs: Clear with good BS bilat with no rales, rhonchi, or wheezes  Heart: Auscultation: RRR.  Abdomen: Normoactive bowel sounds, soft, nontender, without masses or organomegaly palpable  Extremities:  BL LE: No pretibial edema normal ROM and strength.  Skin: Hair: Texture and amount normal with gender appropriate distribution Skin Inspection: No rashes Skin Palpation: Skin temperature, texture, and thickness normal to palpation  Neuro: Cranial nerves: II - XII grossly intact  Motor: Normal strength throughout DTRs: 2+ and symmetric in UE without delay in relaxation phase  Mental Status: Judgment, insight: Intact Orientation:  Oriented to time, place, and person Mood and affect: No depression, anxiety, or agitation     DATA REVIEWED: 01/12/2019   TSH 0.007 uIU/mL  FT4 1.78 ng/dL WBC 2.8 Neut 63.0% BUN/Cr. 12/0.86 Alk. Phos 103 iu/L AST 17 iu/L ALT 24 iu/L    Results for [name] Huff (MRN [id number]) as of 05/17/2019 12:12  Ref. Range 05/16/2019 15:04  TSH Latest Ref Range: 0.35 - 4.50 uIU/mL <0.01 (L)  Triiodothyronine (T3) Latest Ref Range: 76 - 181 ng/dL 239 (H)  T4,Free(Direct) Latest Ref Range: 0.60 - 1.60 ng/dL 1.45  Thyroperoxidase Ab SerPl-aCnc Latest Ref Range: <9 IU/mL 818 (H)    ASSESSMENT/PLAN/RECOMMENDATIONS:   1. Hyperthyroidism :    - Clinically  He seems to be euthyroid  - No local neck symptoms. - Most likely the cause of hyperthyroidism is Graves' disease, given FH of thyroid disease, prior diagnosis of hypothyroidism requiring LT-4 replacement and elevated anti-TPO Ab's . 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   - We discussed that Graves' Disease is a result of an autoimmune condition involving the thyroid.    - We discussed with pt the benefits of methimazole in the Tx of hyperthyroidism, as well as the possible side effects/complications of anti-thyroid drug Tx (specifically detailing the rare, but serious side effect of agranulocytosis). He was informed of need for regular thyroid function monitoring while on methimazole to ensure appropriate dosage without over-treatment. As well, we discussed the possible side effects of methimazole including the chance of rash, the small chance of liver irritation/juandice and the <=1 in 300-400 chance of sudden onset agranulocytosis.  We discussed importance of going to ED promptly (and stopping methimazole) if hewere to develop significant fever with severe sore throat of other evidence of acute infection.      We extensively discussed the various treatment options for hyperthyroidism and Graves disease  including ablation therapy with radioactive iodine versus antithyroid drug treatment versus surgical therapy.  We recommended to the patient that we felt, at this time, that thionamide therapy would be most optimal.  We discussed the various possible benefits versus side effects of the various therapies.    Medications : Methimazole 10 mg daily  Labs in 6 weeks     F/u in 3 months     Signed electronically by: Mack Guise, MD  [facility]  [address] [place], [place] [address] Phone: [phone] FAX: [phone]   CC: [name] Anes, MD [address] [place] [address] Phone: [phone] Fax: [phone]   Return to Endocrinology clinic as below: Future Appointments  Date Time Provider [place]  06/22/2019  1:45 PM [facility] None  08/16/2019  8:50 AM , Melanie Crazier, MD [facility] None

## 2019-05-17 DIAGNOSIS — Z0000 Encounter for general adult medical examination without abnormal findings: Secondary | ICD-10-CM | POA: Diagnosis not present

## 2019-05-17 DIAGNOSIS — E0590 Thyrotoxicosis, unspecified without thyrotoxic crisis or storm: Secondary | ICD-10-CM | POA: Insufficient documentation

## 2019-05-17 DIAGNOSIS — Z1159 Encounter for screening for other viral diseases: Secondary | ICD-10-CM | POA: Diagnosis not present

## 2019-05-17 DIAGNOSIS — I10 Essential (primary) hypertension: Secondary | ICD-10-CM | POA: Insufficient documentation

## 2019-05-17 MED ORDER — METHIMAZOLE 10 MG PO TABS: 10.0000 mg | ORAL_TABLET | Freq: Every day | ORAL | 3 refills | Status: DC

## 2019-05-18 LAB — THYROID PEROXIDASE ANTIBODY: Thyroperoxidase Ab SerPl-aCnc: 818 IU/mL — ABNORMAL HIGH (ref <9)

## 2019-05-18 LAB — T3: T3, Total: 239 ng/dL — ABNORMAL HIGH (ref 76–181)

## 2019-05-18 LAB — TRAB (TSH RECEPTOR BINDING ANTIBODY): TRAB: >40 IU/L — ABNORMAL HIGH (ref <=2.00)

## 2019-05-19 ENCOUNTER — Encounter: Payer: Self-pay | Admitting: Internal Medicine

## 2019-05-30 DIAGNOSIS — K219 Gastro-esophageal reflux disease without esophagitis: Secondary | ICD-10-CM | POA: Diagnosis not present

## 2019-05-30 DIAGNOSIS — J4521 Mild intermittent asthma with (acute) exacerbation: Secondary | ICD-10-CM | POA: Diagnosis not present

## 2019-05-30 DIAGNOSIS — Z1159 Encounter for screening for other viral diseases: Secondary | ICD-10-CM | POA: Diagnosis not present

## 2019-05-30 DIAGNOSIS — E038 Other specified hypothyroidism: Secondary | ICD-10-CM | POA: Diagnosis not present

## 2019-05-30 DIAGNOSIS — E782 Mixed hyperlipidemia: Secondary | ICD-10-CM | POA: Diagnosis not present

## 2019-05-30 DIAGNOSIS — I10 Essential (primary) hypertension: Secondary | ICD-10-CM | POA: Diagnosis not present

## 2019-06-14 DIAGNOSIS — H43393 Other vitreous opacities, bilateral: Secondary | ICD-10-CM | POA: Diagnosis not present

## 2019-06-21 ENCOUNTER — Other Ambulatory Visit: Payer: Self-pay | Admitting: Internal Medicine

## 2019-06-21 DIAGNOSIS — R7989 Other specified abnormal findings of blood chemistry: Secondary | ICD-10-CM

## 2019-06-22 ENCOUNTER — Other Ambulatory Visit (INDEPENDENT_AMBULATORY_CARE_PROVIDER_SITE_OTHER): Payer: Medicare Other

## 2019-06-22 ENCOUNTER — Other Ambulatory Visit: Payer: Self-pay

## 2019-06-22 DIAGNOSIS — R7989 Other specified abnormal findings of blood chemistry: Secondary | ICD-10-CM | POA: Diagnosis not present

## 2019-06-22 LAB — T4, FREE: Free T4: 0.6 ng/dL (ref 0.60–1.60)

## 2019-06-22 LAB — TSH: TSH: 0.79 u[IU]/mL (ref 0.35–4.50)

## 2019-06-23 ENCOUNTER — Telehealth: Payer: Self-pay | Admitting: Internal Medicine

## 2019-06-23 MED ORDER — METHIMAZOLE 10 MG PO TABS: 5.0000 mg | ORAL_TABLET | Freq: Every day | ORAL | 3 refills | Status: DC

## 2019-06-23 NOTE — Telephone Encounter (Signed)
lft vm to return call to discuss results

## 2019-06-23 NOTE — Telephone Encounter (Signed)
[facility] # [phone]

## 2019-06-23 NOTE — Telephone Encounter (Signed)
Pt is aware of the information below. [place] pharmacy closed last week. He will be calling us back to give [place] the updated pharmacy information.

## 2019-06-23 NOTE — Telephone Encounter (Signed)
Pharmacy added to chart.

## 2019-06-23 NOTE — Telephone Encounter (Signed)
Please let him know his thyroid function is normal now while taking 1 tablet of Methimazole    New recommendations   Take half a tablet of Methimazole daily until next blood check which should be  his next visit in ~ 6 weeks.   Thank you    Abby Nena Jordan, MD  [facility] Endocrinology  [facility] [address] [place], [place] [address] Phone: [phone] FAX: [phone]

## 2019-06-23 NOTE — Telephone Encounter (Signed)
Patient stated they are not wanting to cut there 10MG  and would like just the 5MG  called in.  Please Advise, Thanks  Please call patient back, please

## 2019-06-24 ENCOUNTER — Other Ambulatory Visit: Payer: Self-pay

## 2019-06-24 MED ORDER — METHIMAZOLE 5 MG PO TABS: 5.0000 mg | ORAL_TABLET | Freq: Every day | ORAL | 0 refills | Status: DC

## 2019-06-24 NOTE — Telephone Encounter (Signed)
Methimazole 10 mg d/c'ed in chart 5 mg e-scribed to pt pharmacy

## 2019-07-05 DIAGNOSIS — J0100 Acute maxillary sinusitis, unspecified: Secondary | ICD-10-CM | POA: Diagnosis not present

## 2019-07-20 ENCOUNTER — Other Ambulatory Visit: Payer: Self-pay | Admitting: Internal Medicine

## 2019-08-12 ENCOUNTER — Other Ambulatory Visit: Payer: Self-pay

## 2019-08-16 ENCOUNTER — Ambulatory Visit (INDEPENDENT_AMBULATORY_CARE_PROVIDER_SITE_OTHER): Payer: Medicare Other | Admitting: Internal Medicine

## 2019-08-16 ENCOUNTER — Encounter: Payer: Self-pay | Admitting: Internal Medicine

## 2019-08-16 ENCOUNTER — Telehealth: Payer: Self-pay | Admitting: Internal Medicine

## 2019-08-16 ENCOUNTER — Other Ambulatory Visit: Payer: Self-pay

## 2019-08-16 VITALS — BP 118/78 | HR 87 | Temp 98.9°F | Ht 67.0 in | Wt 200.6 lb

## 2019-08-16 DIAGNOSIS — E0500 Thyrotoxicosis with diffuse goiter without thyrotoxic crisis or storm: Secondary | ICD-10-CM | POA: Diagnosis not present

## 2019-08-16 DIAGNOSIS — Z23 Encounter for immunization: Secondary | ICD-10-CM | POA: Diagnosis not present

## 2019-08-16 DIAGNOSIS — R7989 Other specified abnormal findings of blood chemistry: Secondary | ICD-10-CM

## 2019-08-16 DIAGNOSIS — E0590 Thyrotoxicosis, unspecified without thyrotoxic crisis or storm: Secondary | ICD-10-CM

## 2019-08-16 LAB — TSH: TSH: 2 u[IU]/mL (ref 0.35–4.50)

## 2019-08-16 LAB — T4, FREE: Free T4: 0.74 ng/dL (ref 0.60–1.60)

## 2019-08-16 MED ORDER — METHIMAZOLE 5 MG PO TABS: 2.5000 mg | ORAL_TABLET | Freq: Every day | ORAL | 1 refills | Status: DC

## 2019-08-16 NOTE — Progress Notes (Signed)
Name: [name] Huff  MRN/ DOB: [id number], 1946/01/30    Age/ Sex: 73 y.o., male     PCP: Lillard Anes, MD   Reason for Endocrinology Evaluation: Hyperthyroidism     Initial Endocrinology Clinic Visit: 05/16/2019    PATIENT IDENTIFIER: [name] Huff is a 73 y.o., male with a past medical history of HTN. He has followed with [place] [facility] since 05/16/2019 for consultative assistance with management of his Hyperthyroidism   HISTORICAL SUMMARY: The patient was first diagnosed with hypothyroidism many years ago, he was  on LT-4 replacement for many years up to 150 mcg daily,  but in 2019 his dose has been gradually reduced and was off for a few months prior to his presentation to our clinic.    He was diagnosed with hyperthyroidism secondary to graves' disease in 05/2019 and started on Methimazole   Sister with thyroid disease.  SUBJECTIVE:   During last visit (05/17/2019): Started methimazole.   Today (08/16/2019):  [name] Huff is here for a 3 month follow up on hyperthyroidism. He has been compliant with methimazole 5 mg .  He denies any nausea/abdominal pain / diarrhea  He denies any local neck symptoms      ROS:  As per HPI.   HISTORY:  Past Medical History:  Past Medical History:  Diagnosis Date  . Hypertension   . Hypothyroidism     Past Surgical History:  Past Surgical History:  Procedure Laterality Date  . CATARACT EXTRACTION       Social History:  reports that he has never smoked. He has never used smokeless tobacco. He reports that he does not drink alcohol. No history on file for drug. Family History:  Family History  Problem Relation Age of Onset  . Thyroid disease Sister   . Goiter Paternal Grandmother       HOME MEDICATIONS: Allergies as of 08/16/2019   No Known Allergies     Medication List       Accurate as of August 16, 2019  1:46 PM. If you have any questions, ask your nurse or doctor.         albuterol 108 (90 Base) MCG/ACT inhaler Commonly known as: VENTOLIN HFA   cetirizine 10 MG tablet Commonly known as: ZYRTEC Take 10 mg by mouth daily.   lisinopril 20 MG tablet Commonly known as: ZESTRIL   methimazole 5 MG tablet Commonly known as: TAPAZOLE TAKE ONE TABLET BY MOUTH EVERY DAY   omeprazole 40 MG capsule Commonly known as: PRILOSEC         OBJECTIVE:   PHYSICAL EXAM: VS: BP 118/78 (BP Location: Left Arm, Patient Position: Sitting, Cuff Size: Large)   Pulse 87   Temp 98.9 F (37.2 C)   Ht 5\' 7"  (1.702 m)   Wt 200 lb 9.6 oz (91 kg)   SpO2 99%   BMI 31.42 kg/m    EXAM: General: Pt appears well and is in NAD  Neck: General: Supple without adenopathy. Thyroid: Thyroid size normal.  No goiter or nodules appreciated. No thyroid bruit.  Lungs: Clear with good BS bilat with no rales, rhonchi, or wheezes  Heart: Auscultation: RRR.  Abdomen: Normoactive bowel sounds, soft, nontender, without masses or organomegaly palpable  Extremities:  BL LE: No pretibial edema normal ROM and strength.  Skin: Hair: Texture and amount normal with gender appropriate distribution Skin Inspection: No rashes Skin Palpation: Skin temperature, texture, and thickness normal to palpation  Mental Status: Judgment, insight: Intact  Orientation: Oriented to time, place, and person Mood and affect: No depression, anxiety, or agitation     DATA REVIEWED: Results for [name], Huff (MRN [id number]) as of 08/16/2019 13:51  Ref. Range 06/22/2019 14:09 08/16/2019 09:16  TSH Latest Ref Range: 0.35 - 4.50 uIU/mL 0.79 2.00  T4,Free(Direct) Latest Ref Range: 0.60 - 1.60 ng/dL 0.60 0.74     Results for [name], Huff (MRN [id number]) as of 08/16/2019 13:51  Ref. Range 05/16/2019 15:04  TRAB Latest Ref Range: <=2.00 IU/L >40.00 (H)   ASSESSMENT / PLAN / RECOMMENDATIONS:   1. Hyperthyroidism Secondary To Graves' Disease:   - Pt with Hashi-Toxi given his prior diagnosis of  hypothyroidism  - He is clinically euthyroid  - Repeat TFT's show normal function  - No side effects to methimazole    Medications   Decrease Methimazole to 2.5 mg daily    2. Graves' Disease:    - No extra-thyroidal manifestations of graves' disease - Up to date on eye exams   Labs in 6 weeks  F/U in 3 months    Signed electronically by: Mack Guise, MD  [facility] Endocrinology  [place] [facility] [address] [place],  [address] Phone: [phone] FAX: [phone]      CC: Lillard Anes, MD [address] [place] [address] Phone: [phone]  Fax: [phone]   Return to Endocrinology clinic as below: Future Appointments  Date Time Provider [place]  11/16/2019  8:50 AM Callia Swim, Melanie Crazier, MD [facility] None

## 2019-08-16 NOTE — Patient Instructions (Signed)
We recommend that you follow these hyperthyroidism instructions at home:  1) Take Methimazole 5 mg 1 a day  If you develop severe sore throat with high fevers OR develop unexplained yellowing of your skin, eyes, under your tongue, severe abdominal pain with nausea or vomiting --> then please get evaluated immediately.  2) Get repeat thyroid labs in 6 weeks .   It is ESSENTIAL to get follow-up labs to help avoid over or undertreatment of your hyperthyroidism - both of which can be dangerous to your health.

## 2019-08-17 NOTE — Telephone Encounter (Signed)
na

## 2019-09-28 ENCOUNTER — Other Ambulatory Visit (INDEPENDENT_AMBULATORY_CARE_PROVIDER_SITE_OTHER): Payer: Medicare Other

## 2019-09-28 ENCOUNTER — Other Ambulatory Visit: Payer: Self-pay

## 2019-09-28 DIAGNOSIS — R7989 Other specified abnormal findings of blood chemistry: Secondary | ICD-10-CM

## 2019-09-28 LAB — TSH: TSH: 0.34 u[IU]/mL — ABNORMAL LOW (ref 0.35–4.50)

## 2019-09-28 LAB — T4, FREE: Free T4: 0.9 ng/dL (ref 0.60–1.60)

## 2019-09-29 ENCOUNTER — Telehealth: Payer: Self-pay | Admitting: Internal Medicine

## 2019-09-29 MED ORDER — METHIMAZOLE 5 MG PO TABS: 5.0000 mg | ORAL_TABLET | Freq: Every day | ORAL | 6 refills | Status: DC

## 2019-09-29 NOTE — Telephone Encounter (Signed)
Discussed results as below     Results for [name], Huff (MRN [id number]) as of 09/29/2019 12:18  Ref. Range 09/28/2019 09:34  TSH Latest Ref Range: 0.35 - 4.50 uIU/mL 0.34 (L)  T4,Free(Direct) Latest Ref Range: 0.60 - 1.60 ng/dL 0.90    Recommendations Increase methimazole 5 mg daily    Abby Nena Jordan, MD  [facility] Endocrinology  [facility] [address] [place], [place] [address] Phone: [phone] FAX: [phone]

## 2019-11-10 ENCOUNTER — Encounter: Payer: Self-pay | Admitting: Internal Medicine

## 2019-11-10 ENCOUNTER — Ambulatory Visit (INDEPENDENT_AMBULATORY_CARE_PROVIDER_SITE_OTHER): Payer: Medicare Other | Admitting: Internal Medicine

## 2019-11-10 ENCOUNTER — Other Ambulatory Visit: Payer: Self-pay

## 2019-11-10 VITALS — BP 124/78 | HR 89 | Temp 97.9°F | Ht 67.0 in | Wt 209.8 lb

## 2019-11-10 DIAGNOSIS — E0500 Thyrotoxicosis with diffuse goiter without thyrotoxic crisis or storm: Secondary | ICD-10-CM | POA: Diagnosis not present

## 2019-11-10 DIAGNOSIS — E0590 Thyrotoxicosis, unspecified without thyrotoxic crisis or storm: Secondary | ICD-10-CM

## 2019-11-10 LAB — COMPREHENSIVE METABOLIC PANEL
ALT: 16 U/L (ref 0–53)
AST: 17 U/L (ref 0–37)
Albumin: 3.8 g/dL (ref 3.5–5.2)
Alkaline Phosphatase: 80 U/L (ref 39–117)
BUN: 15 mg/dL (ref 6–23)
CO2: 25 mEq/L (ref 19–32)
Calcium: 9 mg/dL (ref 8.4–10.5)
Chloride: 103 mEq/L (ref 96–112)
Creatinine, Ser: 0.99 mg/dL (ref 0.40–1.50)
GFR: 73.79 mL/min (ref >60.00)
Glucose, Bld: 83 mg/dL (ref 70–99)
Potassium: 4.1 mEq/L (ref 3.5–5.1)
Sodium: 135 mEq/L (ref 135–145)
Total Bilirubin: 0.5 mg/dL (ref 0.2–1.2)
Total Protein: 6.8 g/dL (ref 6.0–8.3)

## 2019-11-10 LAB — CBC WITH DIFFERENTIAL/PLATELET
Basophils Absolute: 0.1 10*3/uL (ref 0.0–0.1)
Basophils Relative: 0.5 % (ref 0.0–3.0)
Eosinophils Absolute: 0.1 10*3/uL (ref 0.0–0.7)
Eosinophils Relative: 1.3 % (ref 0.0–5.0)
HCT: 49.5 % (ref 39.0–52.0)
Hemoglobin: 16.5 g/dL (ref 13.0–17.0)
Lymphocytes Relative: 22.4 % (ref 12.0–46.0)
Lymphs Abs: 2.6 10*3/uL (ref 0.7–4.0)
MCHC: 33.4 g/dL (ref 30.0–36.0)
MCV: 84.1 fl (ref 78.0–100.0)
Monocytes Absolute: 1.1 10*3/uL — ABNORMAL HIGH (ref 0.1–1.0)
Monocytes Relative: 9.6 % (ref 3.0–12.0)
Neutro Abs: 7.6 10*3/uL (ref 1.4–7.7)
Neutrophils Relative %: 66.2 % (ref 43.0–77.0)
Platelets: 247 10*3/uL (ref 150.0–400.0)
RBC: 5.89 Mil/uL — ABNORMAL HIGH (ref 4.22–5.81)
RDW: 14.1 % (ref 11.5–15.5)
WBC: 11.4 10*3/uL — ABNORMAL HIGH (ref 4.0–10.5)

## 2019-11-10 LAB — T4, FREE: Free T4: 0.89 ng/dL (ref 0.60–1.60)

## 2019-11-10 LAB — TSH: TSH: 1.8 u[IU]/mL (ref 0.35–4.50)

## 2019-11-10 NOTE — Patient Instructions (Signed)
We recommend that you follow these hyperthyroidism instructions at home:  1) Take Methimazole 5 mg one tablet  a day  If you develop severe sore throat with high fevers OR develop unexplained yellowing of your skin, eyes, under your tongue, severe abdominal pain with nausea or vomiting --> then please get evaluated immediately.  2) Get repeat thyroid labs in 8 weeks .   It is ESSENTIAL to get follow-up labs to help avoid over or undertreatment of your hyperthyroidism - both of which can be dangerous to your health.

## 2019-11-10 NOTE — Progress Notes (Signed)
Name: [name] Huff  MRN/ DOB: [id number], 16-Oct-1946    Age/ Sex: 73 y.o., male     PCP: Lillard Anes, MD   Reason for Endocrinology Evaluation: Hyperthyroidism     Initial Endocrinology Clinic Visit: 05/16/2019    PATIENT IDENTIFIER: [name] is a 74 y.o., male with a past medical history of HTN. He has followed with [place] [facility] since 05/16/2019 for consultative assistance with management of his Hyperthyroidism   HISTORICAL SUMMARY: The patient was first diagnosed with hypothyroidism many years ago, he was  on LT-4 replacement for many years up to 150 mcg daily,  but in 2019 his dose has been gradually reduced and was off for a few months prior to his presentation to our clinic.    He was diagnosed with hyperthyroidism secondary to graves' disease in 05/2019 and started on Methimazole   Sister with thyroid disease.  SUBJECTIVE:   During last visit (08/16/2019): Attempted to reduce Methimazole dose to 2.5 mg but increased it again to 5 mg daily .     Today (11/10/2019):  [name] is here for a 3 month follow up on hyperthyroidism. He has been compliant with methimazole 5 mg .   Has noted some weight changes  He denies any nausea/abdominal pain / diarrhea  He denies any local neck symptoms  Denies eye symptoms     ROS:  As per HPI.   HISTORY:  Past Medical History:  Past Medical History:  Diagnosis Date  . Hypertension   . Hypothyroidism    Past Surgical History:  Past Surgical History:  Procedure Laterality Date  . CATARACT EXTRACTION      Social History:  reports that he has never smoked. He has never used smokeless tobacco. He reports that he does not drink alcohol. No history on file for drug. Family History:  Family History  Problem Relation Age of Onset  . Thyroid disease Sister   . Goiter Paternal Grandmother      HOME MEDICATIONS: Allergies as of 11/10/2019   No Known Allergies     Medication List         Accurate as of November 10, 2019 12:14 PM. If you have any questions, ask your nurse or doctor.        albuterol 108 (90 Base) MCG/ACT inhaler Commonly known as: VENTOLIN HFA   cetirizine 10 MG tablet Commonly known as: ZYRTEC Take 10 mg by mouth daily.   lisinopril 20 MG tablet Commonly known as: ZESTRIL   methimazole 5 MG tablet Commonly known as: TAPAZOLE Take 1 tablet (5 mg total) by mouth daily.   omeprazole 40 MG capsule Commonly known as: PRILOSEC         OBJECTIVE:   PHYSICAL EXAM: VS: BP 124/78 (BP Location: Left Arm, Patient Position: Sitting, Cuff Size: Normal)   Pulse 89   Temp 97.9 F (36.6 C)   Ht 5\' 7"  (1.702 m)   Wt 209 lb 12.8 oz (95.2 kg)   SpO2 99%   BMI 32.86 kg/m    EXAM: General: Pt appears well and is in NAD  Neck: General: Supple without adenopathy. Thyroid: Thyroid size normal.  No goiter or nodules appreciated. No thyroid bruit.  Lungs: Clear with good BS bilat with no rales, rhonchi, or wheezes  Heart: Auscultation: RRR.  Abdomen: Normoactive bowel sounds, soft, nontender, without masses or organomegaly palpable  Extremities:  BL LE: No pretibial edema normal ROM and strength.  Mental Status: Judgment, insight:  Intact Orientation: Oriented to time, place, and person Mood and affect: No depression, anxiety, or agitation     DATA REVIEWED:  Results for MARCAL, LIPPER (MRN [id number]) as of 11/14/2019 07:59  Ref. Range 11/10/2019 09:51  Sodium Latest Ref Range: 135 - 145 mEq/L 135  Potassium Latest Ref Range: 3.5 - 5.1 mEq/L 4.1  Chloride Latest Ref Range: 96 - 112 mEq/L 103  CO2 Latest Ref Range: 19 - 32 mEq/L 25  Glucose Latest Ref Range: 70 - 99 mg/dL 83  BUN Latest Ref Range: 6 - 23 mg/dL 15  Creatinine Latest Ref Range: 0.40 - 1.50 mg/dL 0.99  Calcium Latest Ref Range: 8.4 - 10.5 mg/dL 9.0  Alkaline Phosphatase Latest Ref Range: 39 - 117 U/L 80  Albumin Latest Ref Range: 3.5 - 5.2 g/dL 3.8  AST Latest Ref  Range: 0 - 37 U/L 17  ALT Latest Ref Range: 0 - 53 U/L 16  Total Protein Latest Ref Range: 6.0 - 8.3 g/dL 6.8  Total Bilirubin Latest Ref Range: 0.2 - 1.2 mg/dL 0.5  GFR Latest Ref Range: >60.00 mL/min 73.79  WBC Latest Ref Range: 4.0 - 10.5 K/uL 11.4 (H)  RBC Latest Ref Range: 4.22 - 5.81 Mil/uL 5.89 (H)  Hemoglobin Latest Ref Range: 13.0 - 17.0 g/dL 16.5  HCT Latest Ref Range: 39.0 - 52.0 % 49.5  MCV Latest Ref Range: 78.0 - 100.0 fl 84.1  MCHC Latest Ref Range: 30.0 - 36.0 g/dL 33.4  RDW Latest Ref Range: 11.5 - 15.5 % 14.1  Platelets Latest Ref Range: 150.0 - 400.0 K/uL 247.0  Neutrophils Latest Ref Range: 43.0 - 77.0 % 66.2  Lymphocytes Latest Ref Range: 12.0 - 46.0 % 22.4  Monocytes Relative Latest Ref Range: 3.0 - 12.0 % 9.6  Eosinophil Latest Ref Range: 0.0 - 5.0 % 1.3  Basophil Latest Ref Range: 0.0 - 3.0 % 0.5  NEUT# Latest Ref Range: 1.4 - 7.7 K/uL 7.6  Lymphocyte # Latest Ref Range: 0.7 - 4.0 K/uL 2.6  Monocyte # Latest Ref Range: 0.1 - 1.0 K/uL 1.1 (H)  Eosinophils Absolute Latest Ref Range: 0.0 - 0.7 K/uL 0.1  Basophils Absolute Latest Ref Range: 0.0 - 0.1 K/uL 0.1  TSH Latest Ref Range: 0.35 - 4.50 uIU/mL 1.80  T4,Free(Direct) Latest Ref Range: 0.60 - 1.60 ng/dL 0.89     Results for TORELL, CARDENA (MRN [id number]) as of 08/16/2019 13:51  Ref. Range 05/16/2019 15:04  TRAB Latest Ref Range: <=2.00 IU/L >40.00 (H)   ASSESSMENT / PLAN / RECOMMENDATIONS:   1. Hyperthyroidism Secondary To Graves' Disease:   - Pt with Hashi-Toxi given his prior diagnosis of hypothyroidism  - He is clinically euthyroid  - Repeat TFT's are normal  - No side effects to methimazole    Medications    Methimazole 5 mg daily    2. Graves' Disease:    - No extra-thyroidal manifestations of graves' disease - Up to date on eye exams   Labs in 8 weeks  F/U in 4 months    Signed electronically by: Mack Guise, MD  [facility] Endocrinology  [place]  Group [address] [place], [place] [address] Phone: [phone] FAX: [phone]      CC: Lillard Anes, MD [address] [place] [address] Phone: [phone]  Fax: [phone]   Return to Endocrinology clinic as below: Future Appointments  Date Time Provider [place]  01/04/2020  8:30 AM [facility] None  03/07/2020  8:30 AM Terence Googe, Melanie Crazier, MD [facility] None

## 2019-11-14 ENCOUNTER — Telehealth: Payer: Self-pay | Admitting: Internal Medicine

## 2019-11-14 ENCOUNTER — Encounter: Payer: Self-pay | Admitting: Internal Medicine

## 2019-11-14 MED ORDER — METHIMAZOLE 5 MG PO TABS: 5.0000 mg | ORAL_TABLET | Freq: Every day | ORAL | 3 refills | Status: DC

## 2019-11-14 NOTE — Telephone Encounter (Signed)
Lft vm with instructions

## 2019-11-14 NOTE — Telephone Encounter (Signed)
Spoke to pt and pt aware of results

## 2019-11-14 NOTE — Telephone Encounter (Signed)
Patient requests to be called at ph# [phone] re: he wants to make sure he understands the voice mail from our office. Patient does not want to make appointment to go over lab results because he states he was just here and he lives in [place].

## 2019-11-14 NOTE — Telephone Encounter (Signed)
Please let him know his thyroid is normal, and to continue current dose of methimazole 5 mg daily     Thanks   Abby Nena Jordan, MD  [facility] Endocrinology  [facility] [address] [place], [place] [address] Phone: [phone] FAX: [phone]

## 2019-11-16 ENCOUNTER — Ambulatory Visit: Payer: Medicare Other | Admitting: Internal Medicine

## 2019-11-28 DIAGNOSIS — K219 Gastro-esophageal reflux disease without esophagitis: Secondary | ICD-10-CM | POA: Diagnosis not present

## 2019-11-28 DIAGNOSIS — E0500 Thyrotoxicosis with diffuse goiter without thyrotoxic crisis or storm: Secondary | ICD-10-CM | POA: Diagnosis not present

## 2019-11-28 DIAGNOSIS — E782 Mixed hyperlipidemia: Secondary | ICD-10-CM | POA: Diagnosis not present

## 2019-11-28 DIAGNOSIS — I10 Essential (primary) hypertension: Secondary | ICD-10-CM | POA: Diagnosis not present

## 2019-11-28 DIAGNOSIS — J4521 Mild intermittent asthma with (acute) exacerbation: Secondary | ICD-10-CM | POA: Diagnosis not present

## 2019-12-14 DIAGNOSIS — I10 Essential (primary) hypertension: Secondary | ICD-10-CM | POA: Diagnosis not present

## 2019-12-14 DIAGNOSIS — I6523 Occlusion and stenosis of bilateral carotid arteries: Secondary | ICD-10-CM | POA: Diagnosis not present

## 2019-12-16 ENCOUNTER — Telehealth: Payer: Self-pay

## 2019-12-16 ENCOUNTER — Encounter: Payer: Self-pay | Admitting: Legal Medicine

## 2019-12-16 NOTE — Telephone Encounter (Signed)
Patient was informed about bilateral carotid duplex ultrasound was normal.

## 2019-12-18 NOTE — Progress Notes (Signed)
Normal carotid dopplers lp

## 2019-12-21 ENCOUNTER — Ambulatory Visit (INDEPENDENT_AMBULATORY_CARE_PROVIDER_SITE_OTHER): Payer: Medicare Other | Admitting: Legal Medicine

## 2019-12-21 DIAGNOSIS — Z1211 Encounter for screening for malignant neoplasm of colon: Secondary | ICD-10-CM | POA: Diagnosis not present

## 2019-12-22 DIAGNOSIS — Z1211 Encounter for screening for malignant neoplasm of colon: Secondary | ICD-10-CM | POA: Diagnosis not present

## 2019-12-22 LAB — HEMOCCULT GUIAC POC 1CARD (OFFICE)
Card #2 Fecal Occult Blod, POC: NEGATIVE
Card #3 Fecal Occult Blood, POC: NEGATIVE
Fecal Occult Blood, POC: NEGATIVE

## 2019-12-22 NOTE — Progress Notes (Signed)
All hemocults normal no blood lp

## 2019-12-22 NOTE — Progress Notes (Signed)
Patient brought hemoccults cards to be performed.   Hemoccults card were negative.

## 2020-01-04 ENCOUNTER — Other Ambulatory Visit (INDEPENDENT_AMBULATORY_CARE_PROVIDER_SITE_OTHER): Payer: Medicare Other

## 2020-01-04 ENCOUNTER — Other Ambulatory Visit: Payer: Self-pay

## 2020-01-04 ENCOUNTER — Telehealth: Payer: Self-pay | Admitting: Internal Medicine

## 2020-01-04 DIAGNOSIS — E0590 Thyrotoxicosis, unspecified without thyrotoxic crisis or storm: Secondary | ICD-10-CM | POA: Diagnosis not present

## 2020-01-04 LAB — TSH: TSH: 4.79 u[IU]/mL — ABNORMAL HIGH (ref 0.35–4.50)

## 2020-01-04 LAB — T4, FREE: Free T4: 0.63 ng/dL (ref 0.60–1.60)

## 2020-01-04 MED ORDER — METHIMAZOLE 5 MG PO TABS: 2.5000 mg | ORAL_TABLET | Freq: Every day | ORAL | 3 refills | Status: DC

## 2020-01-04 NOTE — Telephone Encounter (Signed)
Please let him know the current dose is a bit too much and I would suggest cutting down to half a tablet daily.    Will recheck on next visit   Thanks   Abby Nena Jordan, MD  [facility] Endocrinology  [facility] [address] [place], [place] [address] Phone: [phone] FAX: [phone]

## 2020-01-05 NOTE — Telephone Encounter (Signed)
Attempted to reach pt but was not able to leave a vm, will try again later

## 2020-01-05 NOTE — Telephone Encounter (Signed)
Patient returned call, told him Dr Kelton Pillar and her nurse have left for the day and would call him back in the morning. He stated that he would be available after 10am.

## 2020-01-06 NOTE — Telephone Encounter (Signed)
Pt aware of results and informed of changes.

## 2020-01-13 ENCOUNTER — Telehealth (INDEPENDENT_AMBULATORY_CARE_PROVIDER_SITE_OTHER): Payer: Medicare Other | Admitting: Legal Medicine

## 2020-01-13 ENCOUNTER — Encounter: Payer: Self-pay | Admitting: Legal Medicine

## 2020-01-13 VITALS — BP 118/76 | HR 98 | Temp 97.4°F | Ht 67.0 in | Wt 201.0 lb

## 2020-01-13 DIAGNOSIS — J329 Chronic sinusitis, unspecified: Secondary | ICD-10-CM | POA: Insufficient documentation

## 2020-01-13 DIAGNOSIS — J4521 Mild intermittent asthma with (acute) exacerbation: Secondary | ICD-10-CM | POA: Diagnosis not present

## 2020-01-13 DIAGNOSIS — J0100 Acute maxillary sinusitis, unspecified: Secondary | ICD-10-CM | POA: Diagnosis not present

## 2020-01-13 MED ORDER — PREDNISONE 5 MG PO TABS: 5.0000 mg | ORAL_TABLET | Freq: Every day | ORAL | 0 refills | Status: DC

## 2020-01-13 MED ORDER — ALBUTEROL SULFATE (SENSOR) 108 (90 BASE) MCG/ACT IN AEPB: 2.0000 | INHALATION_SPRAY | Freq: Four times a day (QID) | RESPIRATORY_TRACT | 6 refills | Status: DC

## 2020-01-13 MED ORDER — AZITHROMYCIN 250 MG PO TABS: ORAL_TABLET | ORAL | 0 refills | Status: DC

## 2020-01-13 NOTE — Assessment & Plan Note (Signed)
AN INDIVIDUAL CARE PLAN was established and reinforced today.  The patient's status was assessed using clinical findings on exam, labs, and other diagnostic testing. Patient's success at meeting treatment goals based on disease specific evidence-bassed guidelines and found to be in fair control. RECOMMENDATIONS include changing present medicines and treatment. He is to take Zithromax and prednisone as prescribed.

## 2020-01-13 NOTE — Progress Notes (Signed)
Virtual Visit via Telephone Note   This visit type was conducted due to national recommendations for restrictions regarding the COVID-19 Pandemic (e.g. social distancing) in an effort to limit this patient's exposure and mitigate transmission in our community.  Due to his co-morbid illnesses, this patient is at least at moderate risk for complications without adequate follow up.  This format is felt to be most appropriate for this patient at this time.  The patient did not have access to video technology/had technical difficulties with video requiring transitioning to audio format only (telephone).  All issues noted in this document were discussed and addressed.  No physical exam could be performed with this format.  Patient verbally consented to a telehealth visit.   Date:  01/13/2020   ID:  [name] Huff, DOB 18-Jun-1946, MRN [id number]  Patient Location: Home Provider Location: Office  PCP:  Lillard Anes, MD   Evaluation Performed:  New Patient Evaluation  Chief Complaint:  Patient has sinus infection and cough for 2 weeks  No feve  History of Present Illness:    [name] Huff is a 74 y.o. male with sinus congestion for 2 weeks.  He is having wheezing and cough.  He is out of his proair inhaler.  The patient does not have symptoms concerning for COVID-19 infection (fever, chills, cough, or new shortness of breath). He is coughing up yellow phlegm and is having some wheezing.   Past Medical History:  Diagnosis Date  . Hypertension   . Hypothyroidism    Past Surgical History:  Procedure Laterality Date  . CATARACT EXTRACTION      Family History  Problem Relation Age of Onset  . Thyroid disease Sister   . Goiter Paternal Grandmother     Current Meds  Medication Sig  . Albuterol Sulfate, sensor, 108 (90 Base) MCG/ACT AEPB Inhale 2 puffs into the lungs in the morning, at noon, in the evening, and at bedtime. Patient uses proair  . cetirizine (ZYRTEC) 10 MG  tablet Take 10 mg by mouth daily.  Marland Kitchen lisinopril (ZESTRIL) 20 MG tablet   . methimazole (TAPAZOLE) 5 MG tablet Take 0.5 tablets (2.5 mg total) by mouth daily.  Marland Kitchen omeprazole (PRILOSEC) 40 MG capsule   . [DISCONTINUED] albuterol (VENTOLIN HFA) 108 (90 Base) MCG/ACT inhaler      Allergies:   Patient has no known allergies.   Social History   Tobacco Use  . Smoking status: Never Smoker  . Smokeless tobacco: Never Used  Substance Use Topics  . Alcohol use: Never  . Drug use: Never     Family Hx: The patient's family history includes Goiter in his paternal grandmother; Thyroid disease in his sister.  ROS:   Please see the history of present illness.    He is having wheezing, cough, sinus pain and cough productive of yellow Phlegm. All other systems reviewed and are negative.  Labs/Other Tests and Data Reviewed:    Recent Labs: 11/10/2019: ALT 16; BUN 15; Creatinine, Ser 0.99; Hemoglobin 16.5; Platelets 247.0; Potassium 4.1; Sodium 135 01/04/2020: TSH 4.79   Recent Lipid Panel No results found for: CHOL, TRIG, HDL, CHOLHDL, LDLCALC, LDLDIRECT  Wt Readings from Last 3 Encounters:  01/13/20 201 lb (91.2 kg)  11/10/19 209 lb 12.8 oz (95.2 kg)  08/16/19 200 lb 9.6 oz (91 kg)     Objective:    Vital Signs:  BP 118/76   Pulse 98   Temp (!) 97.4 F (36.3 C) (Oral)   Ht  5\' 7"  (1.702 m)   Wt 201 lb (91.2 kg)   BMI 31.48 kg/m    VITAL SIGNS:  reviewed  ASSESSMENT & PLAN:    Problem List Items Addressed This Visit      Respiratory   Sinusitis    AN INDIVIDUAL CARE PLAN was established and reinforced today.  The patient's status was assessed using clinical findings on exam, labs, and other diagnostic testing. Patient's success at meeting treatment goals based on disease specific evidence-bassed guidelines and found to be in fair control. RECOMMENDATIONS include changing present medicines and treatment. He is to take Zithromax and prednisone as prescribed.      Relevant  Medications   azithromycin (ZITHROMAX) 250 MG tablet   predniSONE (DELTASONE) 5 MG tablet      Sinusitis AN INDIVIDUAL CARE PLAN was established and reinforced today.  The patient's status was assessed using clinical findings on exam, labs, and other diagnostic testing. Patient's success at meeting treatment goals based on disease specific evidence-bassed guidelines and found to be in fair control. RECOMMENDATIONS include changing present medicines and treatment. He is to take Zithromax and prednisone as prescribed.   Meds ordered this encounter  Medications  . azithromycin (ZITHROMAX) 250 MG tablet    Sig: 2 tablets on day 1, then 1 tablet daily on days 2-6.    Dispense:  6 tablet    Refill:  0  . predniSONE (DELTASONE) 5 MG tablet    Sig: Take 1 tablet (5 mg total) by mouth daily with breakfast. 50 mg daily x 3 days, then 40 mg daily x 3 days, then 30 mg daily x 3 days, then 20 mg daily x 3 days, then 10 mg daily x 3 days.    Dispense:  21 tablet    Refill:  0    COVID-19 Education: The signs and symptoms of COVID-19 were discussed with the patient and how to seek care for testing (follow up with PCP or arrange E-visit). The importance of social distancing was discussed today.  Time:   Today, I have spent 20 minutes with the patient with telehealth technology discussing the above problems.     Medication Adjustments/Labs and Tests Ordered: Current medicines are reviewed at length with the patient today.  Concerns regarding medicines are outlined above.   Tests Ordered: No orders of the defined types were placed in this encounter.   Medication Changes: Meds ordered this encounter  Medications  . azithromycin (ZITHROMAX) 250 MG tablet    Sig: 2 tablets on day 1, then 1 tablet daily on days 2-6.    Dispense:  6 tablet    Refill:  0  . predniSONE (DELTASONE) 5 MG tablet    Sig: Take 1 tablet (5 mg total) by mouth daily with breakfast. 50 mg daily x 3 days, then 40 mg daily  x 3 days, then 30 mg daily x 3 days, then 20 mg daily x 3 days, then 10 mg daily x 3 days.    Dispense:  21 tablet    Refill:  0    Follow Up:  Either In Person or Virtual prn  Signed, Reinaldo Meeker, MD  01/13/2020 11:17 AM    [place]

## 2020-01-26 ENCOUNTER — Telehealth (INDEPENDENT_AMBULATORY_CARE_PROVIDER_SITE_OTHER): Payer: Medicare Other | Admitting: Legal Medicine

## 2020-01-26 ENCOUNTER — Encounter: Payer: Self-pay | Admitting: Legal Medicine

## 2020-01-26 VITALS — BP 118/69 | HR 79 | Temp 97.4°F | Ht 67.0 in | Wt 200.0 lb

## 2020-01-26 DIAGNOSIS — J019 Acute sinusitis, unspecified: Secondary | ICD-10-CM | POA: Diagnosis not present

## 2020-01-26 DIAGNOSIS — J4521 Mild intermittent asthma with (acute) exacerbation: Secondary | ICD-10-CM | POA: Insufficient documentation

## 2020-01-26 DIAGNOSIS — Z7189 Other specified counseling: Secondary | ICD-10-CM

## 2020-01-26 DIAGNOSIS — J0100 Acute maxillary sinusitis, unspecified: Secondary | ICD-10-CM

## 2020-01-26 MED ORDER — FLUTICASONE PROPIONATE 50 MCG/ACT NA SUSP: 2.0000 | Freq: Every day | NASAL | 6 refills | Status: DC

## 2020-01-26 MED ORDER — AMOXICILLIN-POT CLAVULANATE 875-125 MG PO TABS: 1.0000 | ORAL_TABLET | Freq: Two times a day (BID) | ORAL | 0 refills | Status: DC

## 2020-01-26 MED ORDER — PREDNISONE 50 MG PO TABS: ORAL_TABLET | ORAL | 0 refills | Status: DC

## 2020-01-26 NOTE — Progress Notes (Signed)
Virtual Visit via Telephone Note   This visit type was conducted due to national recommendations for restrictions regarding the COVID-19 Pandemic (e.g. social distancing) in an effort to limit this patient's exposure and mitigate transmission in our community.  Due to his co-morbid illnesses, this patient is at least at moderate risk for complications without adequate follow up.  This format is felt to be most appropriate for this patient at this time.  The patient did not have access to video technology/had technical difficulties with video requiring transitioning to audio format only (telephone).  All issues noted in this document were discussed and addressed.  No physical exam could be performed with this format.  Patient verbally consented to a telehealth visit.   Date:  01/26/2020   ID:  [name] Huff, DOB 02/01/1946, MRN [id number]  Patient Location: Home Provider Location: Office  PCP:  Lillard Anes, MD   Evaluation Performed:  New Patient Evaluation  Chief Complaint:  Sinus infection  History of Present Illness:    [name] Huff is a 74 y.o. male with  Sinus congestion , he did not get better from last infection. He gets nasal drainage and some wheezing.  The patient does not have symptoms concerning for COVID-19 infection (fever, chills, cough, or new shortness of breath).    Past Medical History:  Diagnosis Date  . Asthma   . Hypertension   . Hypothyroidism    Past Surgical History:  Procedure Laterality Date  . CATARACT EXTRACTION    . TONSILECTOMY/ADENOIDECTOMY WITH MYRINGOTOMY  2020    Family History  Problem Relation Age of Onset  . Thyroid disease Sister   . Goiter Paternal Grandmother   . Heart attack Mother   . Colon cancer Father        Patient passed away unknown    Current Meds  Medication Sig  . Albuterol Sulfate, sensor, 108 (90 Base) MCG/ACT AEPB Inhale 2 puffs into the lungs in the morning, at noon, in the evening, and at  bedtime. Patient uses proair  . fexofenadine (ALLEGRA ALLERGY) 180 MG tablet Take 180 mg by mouth daily.  Marland Kitchen lisinopril (ZESTRIL) 20 MG tablet   . methimazole (TAPAZOLE) 5 MG tablet Take 0.5 tablets (2.5 mg total) by mouth daily.  Marland Kitchen omeprazole (PRILOSEC) 40 MG capsule      Allergies:   Patient has no known allergies.   Social History   Tobacco Use  . Smoking status: Never Smoker  . Smokeless tobacco: Never Used  Substance Use Topics  . Alcohol use: Never  . Drug use: Never     Family Hx: The patient's family history includes Colon cancer in his father; Goiter in his paternal grandmother; Heart attack in his mother; Thyroid disease in his sister.  ROS:   Please see the history of present illness.    Review of Systems  Constitutional: Negative.   HENT: Positive for congestion.   Eyes: Negative.   Respiratory: Positive for wheezing.   Cardiovascular: Negative.   Gastrointestinal: Negative.   Genitourinary: Negative.   Musculoskeletal: Negative.   Skin: Negative.   Neurological: Negative.    All other systems reviewed and are negative.  Labs/Other Tests and Data Reviewed:    Recent Labs: 11/10/2019: ALT 16; BUN 15; Creatinine, Ser 0.99; Hemoglobin 16.5; Platelets 247.0; Potassium 4.1; Sodium 135 01/04/2020: TSH 4.79   Recent Lipid Panel No results found for: CHOL, TRIG, HDL, CHOLHDL, LDLCALC, LDLDIRECT  Wt Readings from Last 3 Encounters:  01/26/20 200 lb (  90.7 kg)  01/13/20 201 lb (91.2 kg)  11/10/19 209 lb 12.8 oz (95.2 kg)     Objective:    Vital Signs:  BP 118/69   Pulse 79   Temp (!) 97.4 F (36.3 C)   Ht 5\' 7"  (1.702 m)   Wt 200 lb (90.7 kg)   SpO2 98%   BMI 31.32 kg/m    VITAL SIGNS:  reviewed  ASSESSMENT & PLAN:    Problem List Items Addressed This Visit      Respiratory   Sinusitis   Relevant Medications   fexofenadine (ALLEGRA ALLERGY) 180 MG tablet   amoxicillin-clavulanate (AUGMENTIN) 875-125 MG tablet   predniSONE (DELTASONE) 50 MG  tablet   fluticasone (FLONASE) 50 MCG/ACT nasal spray   Mild intermittent asthma with acute exacerbation - Primary   Relevant Medications   amoxicillin-clavulanate (AUGMENTIN) 875-125 MG tablet   predniSONE (DELTASONE) 50 MG tablet   fluticasone (FLONASE) 50 MCG/ACT nasal spray      No problem-specific Assessment & Plan notes found for this encounter.   Meds ordered this encounter  Medications  . amoxicillin-clavulanate (AUGMENTIN) 875-125 MG tablet    Sig: Take 1 tablet by mouth 2 (two) times daily.    Dispense:  20 tablet    Refill:  0  . predniSONE (DELTASONE) 50 MG tablet    Sig: One pill a day for 5 days    Dispense:  5 tablet    Refill:  0  . fluticasone (FLONASE) 50 MCG/ACT nasal spray    Sig: Place 2 sprays into both nostrils daily.    Dispense:  16 g    Refill:  6    COVID-19 Education: The signs and symptoms of COVID-19 were discussed with the patient and how to seek care for testing (follow up with PCP or arrange E-visit). The importance of social distancing was discussed today.  Time:   Today, I have spent 20 minutes with the patient with telehealth technology discussing the above problems.     Medication Adjustments/Labs and Tests Ordered: Current medicines are reviewed at length with the patient today.  Concerns regarding medicines are outlined above.   Tests Ordered: No orders of the defined types were placed in this encounter.   Medication Changes: Meds ordered this encounter  Medications  . amoxicillin-clavulanate (AUGMENTIN) 875-125 MG tablet    Sig: Take 1 tablet by mouth 2 (two) times daily.    Dispense:  20 tablet    Refill:  0  . predniSONE (DELTASONE) 50 MG tablet    Sig: One pill a day for 5 days    Dispense:  5 tablet    Refill:  0  . fluticasone (FLONASE) 50 MCG/ACT nasal spray    Sig: Place 2 sprays into both nostrils daily.    Dispense:  16 g    Refill:  6    Follow Up:  In Person prn  Signed, Reinaldo Meeker, MD  01/26/2020  3:55 PM    [place]

## 2020-01-26 NOTE — Assessment & Plan Note (Signed)
Wheezing started again, [place] rescue inhalers and medicines for sinusitis.  Follow up if not improving.

## 2020-01-26 NOTE — Assessment & Plan Note (Signed)
Patient having recurrent sinusitis, treat with higher dose prednisone 50mg  qd for 5 days dn regular flonase daily.  Also change to Augmentin.

## 2020-03-05 ENCOUNTER — Other Ambulatory Visit: Payer: Self-pay

## 2020-03-07 ENCOUNTER — Other Ambulatory Visit: Payer: Self-pay

## 2020-03-07 ENCOUNTER — Encounter: Payer: Self-pay | Admitting: Internal Medicine

## 2020-03-07 ENCOUNTER — Ambulatory Visit: Payer: Medicare Other | Admitting: Internal Medicine

## 2020-03-07 VITALS — BP 124/68 | HR 85 | Temp 98.1°F | Ht 67.0 in | Wt 203.0 lb

## 2020-03-07 DIAGNOSIS — E0590 Thyrotoxicosis, unspecified without thyrotoxic crisis or storm: Secondary | ICD-10-CM

## 2020-03-07 LAB — T4, FREE: Free T4: 1.12 ng/dL (ref 0.60–1.60)

## 2020-03-07 LAB — TSH: TSH: 0.02 u[IU]/mL — ABNORMAL LOW (ref 0.35–4.50)

## 2020-03-07 MED ORDER — METHIMAZOLE 5 MG PO TABS: 5.0000 mg | ORAL_TABLET | ORAL | 1 refills | Status: DC

## 2020-03-07 NOTE — Progress Notes (Signed)
Name: [name] Huff  MRN/ DOB: [id number], [birth year]    Age/ Sex: 74 y.o., male     PCP: Lillard Anes, MD   Reason for Endocrinology Evaluation: Hyperthyroidism     Initial Endocrinology Clinic Visit: 05/16/2019    PATIENT IDENTIFIER: [name] Huff is a 74 y.o., male with a past medical history of HTN. He has followed with [facility] since 05/16/2019 for consultative assistance with management of his Hyperthyroidism   HISTORICAL SUMMARY: The patient was first diagnosed with hypothyroidism many years ago, he was  on LT-4 replacement for many years up to 150 mcg daily,  but in 2019 his dose has been gradually reduced and was off for a few months prior to his presentation to our clinic.    He was diagnosed with hyperthyroidism secondary to graves' disease in 05/2019 and started on Methimazole   Sister with thyroid disease.  SUBJECTIVE:   During last visit (11/10/2019): No changes were made to methimazole.    Today (03/07/2020):  [name] Huff is here for a 3 month follow up on hyperthyroidism. He has been compliant with methimazole 2.5  mg .   Weight has been steady  He denies any nausea/abdominal pain / diarrhea  He denies any local neck symptoms  Denies eye symptoms     ROS:  As per HPI.   HISTORY:  Past Medical History:  Past Medical History:  Diagnosis Date  . Asthma   . Hypertension   . Hypothyroidism    Past Surgical History:  Past Surgical History:  Procedure Laterality Date  . CATARACT EXTRACTION    . TONSILECTOMY/ADENOIDECTOMY WITH MYRINGOTOMY  2020    Social History:  reports that he has never smoked. He has never used smokeless tobacco. He reports that he does not drink alcohol or use drugs. Family History:  Family History  Problem Relation Age of Onset  . Thyroid disease Sister   . Goiter Paternal Grandmother   . Heart attack Mother   . Colon cancer Father        Patient passed away unknown     HOME  MEDICATIONS: Allergies as of 03/07/2020   No Known Allergies     Medication List       Accurate as of March 07, 2020  8:39 AM. If you have any questions, ask your nurse or doctor.        Albuterol Sulfate (sensor) 108 (90 Base) MCG/ACT Aepb Inhale 2 puffs into the lungs in the morning, at noon, in the evening, and at bedtime. Patient uses proair   Allegra Allergy 180 MG tablet Generic drug: fexofenadine Take 180 mg by mouth daily.   amoxicillin-clavulanate 875-125 MG tablet Commonly known as: AUGMENTIN Take 1 tablet by mouth 2 (two) times daily.   fluticasone 50 MCG/ACT nasal spray Commonly known as: FLONASE Place 2 sprays into both nostrils daily.   lisinopril 20 MG tablet Commonly known as: ZESTRIL   methimazole 5 MG tablet Commonly known as: TAPAZOLE Take 0.5 tablets (2.5 mg total) by mouth daily.   omeprazole 40 MG capsule Commonly known as: PRILOSEC   predniSONE 50 MG tablet Commonly known as: DELTASONE One pill a day for 5 days         OBJECTIVE:   PHYSICAL EXAM: VS: BP 124/68 (BP Location: Left Arm, Patient Position: Sitting, Cuff Size: Large)   Pulse 85   Temp 98.1 F (36.7 C)   Ht 5\' 7"  (1.702 m)   Wt 203 lb (92.1  kg)   SpO2 98%   BMI 31.79 kg/m    EXAM: General: Pt appears well and is in NAD  Neck: General: Supple without adenopathy. Thyroid: Thyroid size normal.  No goiter or nodules appreciated. No thyroid bruit.  Lungs: Clear with good BS bilat with no rales, rhonchi, or wheezes  Heart: Auscultation: RRR.  Abdomen: Normoactive bowel sounds, soft, nontender, without masses or organomegaly palpable  Extremities:  BL LE: No pretibial edema normal ROM and strength.  Mental Status: Judgment, insight: Intact Orientation: Oriented to time, place, and person Mood and affect: No depression, anxiety, or agitation     DATA REVIEWED:  Results for [name] Huff, [name] Huff (MRN [id number]) as of 03/07/2020 15:16  Ref. Range 01/04/2020 08:35 03/07/2020  08:58  TSH Latest Ref Range: 0.35 - 4.50 uIU/mL 4.79 (H) 0.02 (L)  T4,Free(Direct) Latest Ref Range: 0.60 - 1.60 ng/dL 0.63 1.12     Results for [name] Huff, [name] Huff (MRN [id number]) as of 08/16/2019 13:51  Ref. Range 05/16/2019 15:04  TRAB Latest Ref Range: <=2.00 IU/L >40.00 (H)   ASSESSMENT / PLAN / RECOMMENDATIONS:   1. Hyperthyroidism Secondary To Graves' Disease:   - Pt with Hashi-Toxi given his prior diagnosis of hypothyroidism  - He is clinically euthyroid  - No side effects to methimazole  - Repeat TFT's today show subclinical Hyperthyroidism today , this is the second time that we attempt to reduce his dose of methimazole and his TSH decreased drastically, we are going to increase the dose and see how he done but the next check up, if we continue to have difficulty weaning his dose down, will discuss long term treatment with RAI ablation or surgery.   Medications  Methimazole 5 mg, Mon, Tue, Wed and Thursday Methimazole 2.5 mg daily Friday, Saturday and Sunday    2. Graves' Disease:    - No extra-thyroidal manifestations of graves' disease - Up to date on eye exams    F/U in 4 months    Addendum: discussed labs results with pt on 03/07/2020  Signed electronically by: Mack Guise, MD  [facility] End[facility]  [facility] [address], [place] [address] Phone: [phone] FAX: [phone]      CC: Lillard Anes, MD [place] [address] Phone: [phone]  Fax: [phone]   Return to Endocrinology clinic as below: Future Appointments  Date Time Provider [place]  06/11/2020  7:30 AM Lillard Anes, MD [facility] None

## 2020-03-07 NOTE — Patient Instructions (Signed)
- 

## 2020-03-08 ENCOUNTER — Encounter: Payer: Self-pay | Admitting: Legal Medicine

## 2020-03-08 ENCOUNTER — Ambulatory Visit (INDEPENDENT_AMBULATORY_CARE_PROVIDER_SITE_OTHER): Payer: Medicare Other | Admitting: Legal Medicine

## 2020-03-08 VITALS — BP 120/76 | HR 111 | Temp 97.8°F | Ht 67.0 in | Wt 202.4 lb

## 2020-03-08 DIAGNOSIS — J3089 Other allergic rhinitis: Secondary | ICD-10-CM

## 2020-03-08 DIAGNOSIS — J4521 Mild intermittent asthma with (acute) exacerbation: Secondary | ICD-10-CM | POA: Diagnosis not present

## 2020-03-08 DIAGNOSIS — J45909 Unspecified asthma, uncomplicated: Secondary | ICD-10-CM | POA: Diagnosis not present

## 2020-03-08 MED ORDER — MONTELUKAST SODIUM 10 MG PO TABS: 10.0000 mg | ORAL_TABLET | Freq: Every day | ORAL | 3 refills | Status: DC

## 2020-03-08 MED ORDER — TRIAMCINOLONE ACETONIDE 40 MG/ML IJ SUSP
80.0000 mg | Freq: Once | INTRAMUSCULAR | Status: AC
  Administered 2020-03-08: 80 mg via INTRAMUSCULAR

## 2020-03-08 NOTE — Assessment & Plan Note (Signed)
This patient has asthma mild and is on albuterol.  Patient is not having a flair.  Chronic medicines include albuterol. Addition new medicines singualir.  Asthma action plan is in place.

## 2020-03-08 NOTE — Progress Notes (Signed)
Established Patient Office Visit  Subjective:  Patient ID: [name] Huff, male    DOB: 07-21-1946  Age: 74 y.o. MRN: [id number]  CC:  Chief Complaint  Patient presents with  . Allergies    HPI [name] Huff presents for repeated allergies and sinus infection for 2 months.  He has been treated with antibiotics and prednisone.  He continues to have problems.  Past Medical History:  Diagnosis Date  . Asthma   . Hypertension   . Hypothyroidism     Past Surgical History:  Procedure Laterality Date  . CATARACT EXTRACTION    . TONSILECTOMY/ADENOIDECTOMY WITH MYRINGOTOMY  2020    Family History  Problem Relation Age of Onset  . Thyroid disease Sister   . Goiter Paternal Grandmother   . Heart attack Mother   . Colon cancer Father        Patient passed away unknown    Social History   Socioeconomic History  . Marital status: Legally Separated    Spouse name: Not on file  . Number of children: 0  . Years of education: Not on file  . Highest education level: Not on file  Occupational History  . 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[employer]  Tobacco Use  . Smoking status: Never Smoker  . Smokeless tobacco: Never Used  Substance and Sexual Activity  . Alcohol use: Never  . Drug use: Never  . Sexual activity: Yes    Partners: Female  Other Topics Concern  . Not on file  Social History Narrative  . Not on file   Social Determinants of Health   Financial Resource Strain:   . Difficulty of Paying Living Expenses:   Food Insecurity:   . Worried About Charity fundraiser in the Last Year:   . Arboriculturist in the Last Year:   Transportation Needs:   . Film/video editor (Medical):   Marland Kitchen Lack of Transportation (Non-Medical):   Physical Activity:   . Days of Exercise per Week:   . Minutes of Exercise per Session:   Stress:   . Feeling of Stress :   Social Connections:   . Frequency of Communication with Friends and Family:   .  Frequency of Social Gatherings with Friends and Family:   . Attends Religious Services:   . Active Member of Clubs or Organizations:   . Attends Archivist Meetings:   Marland Kitchen Marital Status:   Intimate Partner Violence:   . Fear of Current or Ex-Partner:   . Emotionally Abused:   Marland Kitchen Physically Abused:   . Sexually Abused:     Outpatient Medications Prior to Visit  Medication Sig Dispense Refill  . Albuterol Sulfate, sensor, 108 (90 Base) MCG/ACT AEPB Inhale 2 puffs into the lungs in the morning, at noon, in the evening, and at bedtime. Patient uses proair 1 each 6  . fexofenadine (ALLEGRA ALLERGY) 180 MG tablet Take 180 mg by mouth daily.    Marland Kitchen lisinopril (ZESTRIL) 20 MG tablet     . methimazole (TAPAZOLE) 5 MG tablet Take 1 tablet (5 mg total) by mouth as directed. 1 tablet Mon- Thursday Half a tablet Friday- Sunday 60 tablet 1  . omeprazole (PRILOSEC) 40 MG capsule     . fluticasone (FLONASE) 50 MCG/ACT nasal spray Place 2 sprays into both nostrils daily. 16 g 6   No facility-administered medications prior to visit.    No Known Allergies  ROS Review of  Systems  Constitutional: Negative.   HENT: Positive for congestion, rhinorrhea and sinus pain.   Eyes: Negative.   Respiratory: Negative.   Cardiovascular: Negative.   Gastrointestinal: Negative.   Endocrine: Negative.   Genitourinary: Negative.   Musculoskeletal: Negative.   Skin: Negative.   Neurological: Negative.   Psychiatric/Behavioral: Negative.       Objective:    Physical Exam  BP 120/76   Pulse (!) 111   Temp 97.8 F (36.6 C)   Ht 5\' 7"  (1.702 m)   Wt 202 lb 6.4 oz (91.8 kg)   SpO2 96%   BMI 31.70 kg/m  Wt Readings from Last 3 Encounters:  03/08/20 202 lb 6.4 oz (91.8 kg)  03/07/20 203 lb (92.1 kg)  01/26/20 200 lb (90.7 kg)     Health Maintenance Due  Topic Date Due  . Hepatitis C Screening  Never done  . COVID-19 Vaccine (1) Never done  . TETANUS/TDAP  Never done  . COLONOSCOPY  Never  done  . PNA vac Low Risk Adult (1 of 2 - PCV13) Never done    There are no preventive care reminders to display for this patient.  Lab Results  Component Value Date   TSH 0.02 (L) 03/07/2020   Lab Results  Component Value Date   WBC 11.4 (H) 11/10/2019   HGB 16.5 11/10/2019   HCT 49.5 11/10/2019   MCV 84.1 11/10/2019   PLT 247.0 11/10/2019   Lab Results  Component Value Date   NA 135 11/10/2019   K 4.1 11/10/2019   CO2 25 11/10/2019   GLUCOSE 83 11/10/2019   BUN 15 11/10/2019   CREATININE 0.99 11/10/2019   BILITOT 0.5 11/10/2019   ALKPHOS 80 11/10/2019   AST 17 11/10/2019   ALT 16 11/10/2019   PROT 6.8 11/10/2019   ALBUMIN 3.8 11/10/2019   CALCIUM 9.0 11/10/2019   GFR 73.79 11/10/2019   No results found for: CHOL No results found for: HDL No results found for: LDLCALC No results found for: TRIG No results found for: CHOLHDL No results found for: HGBA1C    Assessment & Plan:   Problem List Items Addressed This Visit      Respiratory   Mild intermittent asthma with acute exacerbation    This patient has asthma mild and is on albuterol.  Patient is not having a flair.  Chronic medicines include albuterol. Addition new medicines singualir.  Asthma action plan is in place.       Relevant Medications   montelukast (SINGULAIR) 10 MG tablet    Other Visit Diagnoses    Asthma due to environmental allergies    -  Primary   Relevant Medications   montelukast (SINGULAIR) 10 MG tablet   triamcinolone acetonide (KENALOG-40) injection 80 mg (Completed)      Meds ordered this encounter  Medications  . montelukast (SINGULAIR) 10 MG tablet    Sig: Take 1 tablet (10 mg total) by mouth at bedtime.    Dispense:  30 tablet    Refill:  3  . triamcinolone acetonide (KENALOG-40) injection 80 mg    Follow-up: Return if symptoms worsen or fail to improve.    Reinaldo Meeker, MD

## 2020-03-08 NOTE — Assessment & Plan Note (Signed)
Patient doing poorly in this allergy season being treated for 2 sinusitis.  We will start singulair and kenalog 80mg  injection

## 2020-04-13 ENCOUNTER — Other Ambulatory Visit: Payer: Self-pay | Admitting: Legal Medicine

## 2020-04-17 ENCOUNTER — Telehealth: Payer: Self-pay | Admitting: Internal Medicine

## 2020-04-17 NOTE — Telephone Encounter (Signed)
Patient requests to be called at ph# [phone] or ph# [phone] re: Patient would like to know if he should schedule a lab appointment. Patient states he normally get his labs done in between appointments with Dr. Kelton Pillar

## 2020-04-17 NOTE — Telephone Encounter (Signed)
Please advise 

## 2020-04-17 NOTE — Telephone Encounter (Signed)
Pt informed

## 2020-06-11 ENCOUNTER — Encounter: Payer: Self-pay | Admitting: Legal Medicine

## 2020-06-11 ENCOUNTER — Other Ambulatory Visit: Payer: Self-pay

## 2020-06-11 ENCOUNTER — Ambulatory Visit (INDEPENDENT_AMBULATORY_CARE_PROVIDER_SITE_OTHER): Payer: Medicare Other | Admitting: Legal Medicine

## 2020-06-11 VITALS — BP 124/78 | HR 80 | Temp 97.6°F | Resp 16 | Ht 67.0 in | Wt 202.0 lb

## 2020-06-11 DIAGNOSIS — E785 Hyperlipidemia, unspecified: Secondary | ICD-10-CM | POA: Insufficient documentation

## 2020-06-11 DIAGNOSIS — K219 Gastro-esophageal reflux disease without esophagitis: Secondary | ICD-10-CM | POA: Insufficient documentation

## 2020-06-11 DIAGNOSIS — Z6831 Body mass index (BMI) 31.0-31.9, adult: Secondary | ICD-10-CM | POA: Diagnosis not present

## 2020-06-11 DIAGNOSIS — E0500 Thyrotoxicosis with diffuse goiter without thyrotoxic crisis or storm: Secondary | ICD-10-CM | POA: Diagnosis not present

## 2020-06-11 DIAGNOSIS — I10 Essential (primary) hypertension: Secondary | ICD-10-CM

## 2020-06-11 NOTE — Progress Notes (Signed)
Subjective:  Patient ID: [name] Huff, male    DOB: 11-28-1945  Age: 74 y.o. MRN: [id number]  Chief Complaint  Patient presents with  . Hypertension  . Hyperlipidemia  . Hypothyroidism    HPI: chronic visit  Patient presents for follow up of hypertension.  Patient tolerating lisinopril well with side effects.  Patient was diagnosed with hypertension 2010 so has been treated for hypertension for 10 years.Patient is working on maintaining diet and exercise regimen and follows up as directed. Complication include none.  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 He is seeing endocrine.   Current Outpatient Medications on File Prior to Visit  Medication Sig Dispense Refill  . Albuterol Sulfate, sensor, 108 (90 Base) MCG/ACT AEPB Inhale 2 puffs into the lungs in the morning, at noon, in the evening, and at bedtime. Patient uses proair 1 each 6  . fexofenadine (ALLEGRA ALLERGY) 180 MG tablet Take 180 mg by mouth daily.    Marland Kitchen lisinopril (ZESTRIL) 20 MG tablet TAKE ONE TABLET BY MOUTH EVERY DAY 90 tablet 2  . methimazole (TAPAZOLE) 5 MG tablet Take 1 tablet (5 mg total) by mouth as directed. 1 tablet Mon- Thursday Half a tablet Friday- Sunday 60 tablet 1  . montelukast (SINGULAIR) 10 MG tablet Take 1 tablet (10 mg total) by mouth at bedtime. 30 tablet 3  . omeprazole (PRILOSEC) 40 MG capsule TAKE 1 CAPSULE BY MOUTH EVERY DAY 90 capsule 2   No current facility-administered medications on file prior to visit.   Past Medical History:  Diagnosis Date  . Asthma   . GERD (gastroesophageal reflux disease)   . Hyperlipidemia   . Hypertension   . Hypothyroidism   . 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 CATARACT EXTRACTION    . TONSILECTOMY/ADENOIDECTOMY WITH MYRINGOTOMY  2020    Family History  Problem Relation Age of Onset  . Thyroid disease Sister   . Goiter Paternal Grandmother   .  Heart attack Mother   . Colon cancer Father        Patient passed away unknown   Social History   Socioeconomic History  . Marital status: Legally Separated    Spouse name: Not on file  . Number of children: 0  . Years of education: Not on file  . Highest education level: Not on file  Occupational History  . 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[employer]  Tobacco Use  . Smoking status: Never Smoker  . Smokeless tobacco: Never Used  Substance and Sexual Activity  . Alcohol use: Never  . Drug use: Never  . Sexual activity: Yes    Partners: Female  Other Topics Concern  . Not on file  Social History Narrative  . Not on file   Social Determinants of Health   Financial Resource Strain:   . Difficulty of Paying Living Expenses:   Food Insecurity:   . Worried About Charity fundraiser in the Last Year:   . Arboriculturist in the Last Year:   Transportation Needs:   . Film/video editor (Medical):   Marland Kitchen Lack of Transportation (Non-Medical):   Physical Activity:   . Days of Exercise per Week:   . Minutes of Exercise per Session:   Stress:   . Feeling of Stress :   Social Connections:   . Frequency of Communication with Friends and Family:   .  Frequency of Social Gatherings with Friends and Family:   . Attends Religious Services:   . Active Member of Clubs or Organizations:   . Attends Archivist Meetings:   Marland Kitchen Marital Status:     Review of Systems  Constitutional: Negative.   HENT: Negative.   Eyes: Negative.   Respiratory: Negative.   Cardiovascular: Negative.   Gastrointestinal: Negative.   Genitourinary: Negative.   Musculoskeletal: Positive for arthralgias.  Skin: Negative.   Neurological: Negative.   Psychiatric/Behavioral: Negative.      Objective:  BP 124/78   Pulse 80   Temp 97.6 F (36.4 C)   Resp 16   Ht 5\' 7"  (1.702 m)   Wt 202 lb (91.6 kg)   BMI 31.64 kg/m   BP/Weight 06/11/2020 03/08/2020 4/92/0100    Systolic BP 712 197 588  Diastolic BP 78 76 68  Wt. (Lbs) 202 202.4 203  BMI 31.64 31.7 31.79    Physical Exam Vitals reviewed.  Constitutional:      Appearance: Normal appearance.  HENT:     Head: Normocephalic and atraumatic.     Right Ear: Tympanic membrane normal.     Left Ear: Tympanic membrane normal.     Nose: Nose normal.     Mouth/Throat:     Mouth: Mucous membranes are moist.     Pharynx: Oropharynx is clear.  Eyes:     Extraocular Movements: Extraocular movements intact.     Conjunctiva/sclera: Conjunctivae normal.     Pupils: Pupils are equal, round, and reactive to light.  Cardiovascular:     Rate and Rhythm: Normal rate and regular rhythm.     Pulses: Normal pulses.     Heart sounds: Normal heart sounds.  Pulmonary:     Effort: Pulmonary effort is normal.     Breath sounds: Normal breath sounds.  Abdominal:     General: Abdomen is flat. Bowel sounds are normal.     Palpations: Abdomen is soft.  Musculoskeletal:        General: Normal range of motion.     Cervical back: Normal range of motion and neck supple.  Skin:    General: Skin is warm and dry.     Capillary Refill: Capillary refill takes less than 2 seconds.  Neurological:     General: No focal deficit present.     Mental Status: He is alert and oriented to person, place, and time. Mental status is at baseline.  Psychiatric:        Mood and Affect: Mood normal.       Lab Results  Component Value Date   WBC 11.4 (H) 11/10/2019   HGB 16.5 11/10/2019   HCT 49.5 11/10/2019   PLT 247.0 11/10/2019   GLUCOSE 83 11/10/2019   ALT 16 11/10/2019   AST 17 11/10/2019   NA 135 11/10/2019   K 4.1 11/10/2019   CL 103 11/10/2019   CREATININE 0.99 11/10/2019   BUN 15 11/10/2019   CO2 25 11/10/2019   TSH 0.02 (L) 03/07/2020      Assessment & Plan:   1. Graves disease - TSH AN INDIVIDUAL CARE PLAN for graves disease was established and reinforced today.  The patient's status was assessed using  clinical findings on exam, labs, and other diagnostic testing. Patient's success at meeting treatment goals based on disease specific evidence-bassed guidelines and found to be in good control. RECOMMENDATIONS include maintaining present medicines and treatment. 2. Gastroesophageal reflux disease without esophagitis Plan of care was formulated  today.  he is doing well.  A plan of care was formulated using patient exam, tests and other sources to optimize care using evidence based information.  Recommend no smoking, no eating after supper, avoid fatty foods, elevate Head of bed, avoid tight fitting clothing.  Continue on omeprazole.  3. Essential hypertension - CBC with Differential/Platelet - Comprehensive metabolic panel An individual hypertension care plan was established and reinforced today.  The patient's status was assessed using clinical findings on exam and labs or diagnostic tests. The patient's success at meeting treatment goals on disease specific evidence-based guidelines and found to be well controlled. SELF MANAGEMENT: The patient and I together assessed ways to personally work towards obtaining the recommended goals. RECOMMENDATIONS: avoid decongestants found in common cold remedies, decrease consumption of alcohol, perform routine monitoring of BP with home BP cuff, exercise, reduction of dietary salt, take medicines as prescribed, try not to miss doses and quit smoking.  Regular exercise and maintaining a healthy weight is needed.  Stress reduction may help. A CLINICAL SUMMARY including written plan identify barriers to care unique to individual due to social or financial issues.  We attempt to mutually creat solutions for individual and family understanding.  4. BMI 31.0-31.9,adult An individualize plan was formulated for obesity using patient history and physical exam to encourage weight loss.  An evidence based program was formulated.  Patient is to cut portion size with meals and  to plan physical exercise 3 days a week at least 20 minutes.  Weight watchers and other programs are helpful.  Planned amount of weight loss 5 lbs.  Orders Placed This Encounter  Procedures  . CBC with Differential/Platelet  . Comprehensive metabolic panel  . TSH     Follow-up: Return in about 3 weeks (around 07/02/2020) for fasting.  An After Visit Summary was printed and given to the patient.  [place] [phone]

## 2020-06-12 ENCOUNTER — Telehealth: Payer: Self-pay | Admitting: Internal Medicine

## 2020-06-12 LAB — CBC WITH DIFFERENTIAL/PLATELET
Basophils Absolute: 0.1 10*3/uL (ref 0.0–0.2)
Basos: 1 %
EOS (ABSOLUTE): 0.2 10*3/uL (ref 0.0–0.4)
Eos: 3 %
Hematocrit: 49.4 % (ref 37.5–51.0)
Hemoglobin: 16.3 g/dL (ref 13.0–17.7)
Immature Grans (Abs): 0.1 10*3/uL (ref 0.0–0.1)
Immature Granulocytes: 1 %
Lymphocytes Absolute: 2.3 10*3/uL (ref 0.7–3.1)
Lymphs: 27 %
MCH: 27.1 pg (ref 26.6–33.0)
MCHC: 33 g/dL (ref 31.5–35.7)
MCV: 82 fL (ref 79–97)
Monocytes Absolute: 0.7 10*3/uL (ref 0.1–0.9)
Monocytes: 8 %
Neutrophils Absolute: 5.3 10*3/uL (ref 1.4–7.0)
Neutrophils: 60 %
Platelets: 249 10*3/uL (ref 150–450)
RBC: 6.01 x10E6/uL — ABNORMAL HIGH (ref 4.14–5.80)
RDW: 14.4 % (ref 11.6–15.4)
WBC: 8.6 10*3/uL (ref 3.4–10.8)

## 2020-06-12 LAB — COMPREHENSIVE METABOLIC PANEL
ALT: 15 IU/L (ref 0–44)
AST: 16 IU/L (ref 0–40)
Albumin/Globulin Ratio: 1.4 (ref 1.2–2.2)
Albumin: 4.1 g/dL (ref 3.7–4.7)
Alkaline Phosphatase: 107 IU/L (ref 48–121)
BUN/Creatinine Ratio: 12 (ref 10–24)
BUN: 12 mg/dL (ref 8–27)
Bilirubin Total: 0.6 mg/dL (ref 0.0–1.2)
CO2: 23 mmol/L (ref 20–29)
Calcium: 9.6 mg/dL (ref 8.6–10.2)
Chloride: 98 mmol/L (ref 96–106)
Creatinine, Ser: 1.01 mg/dL (ref 0.76–1.27)
GFR calc Af Amer: 84 mL/min/{1.73_m2} (ref >59)
GFR calc non Af Amer: 72 mL/min/{1.73_m2} (ref >59)
Globulin, Total: 3 g/dL (ref 1.5–4.5)
Glucose: 89 mg/dL (ref 65–99)
Potassium: 4.6 mmol/L (ref 3.5–5.2)
Sodium: 137 mmol/L (ref 134–144)
Total Protein: 7.1 g/dL (ref 6.0–8.5)

## 2020-06-12 LAB — TSH: TSH: 0.236 u[IU]/mL — ABNORMAL LOW (ref 0.450–4.500)

## 2020-06-12 NOTE — Telephone Encounter (Signed)
[name] Huff,   Please let the pt know that I received his labs from Dr. Blanch Media office and his thyroid is still slightly overactive. I suggest he started taking 1 full table of methimazole DAILY from now on. ( he was on 1 tablet certain days and Half a tablet certain days )    Thanks  [place], MD  [facility]  [facility] [address] [place], [place] [address] Phone: [phone] FAX: [phone]

## 2020-06-12 NOTE — Telephone Encounter (Signed)
Pt informed

## 2020-06-12 NOTE — Progress Notes (Signed)
CBC normal, kidney and liver tests normal, TSH low send copy to Dr. Lind Guest lp

## 2020-06-28 ENCOUNTER — Other Ambulatory Visit: Payer: Self-pay

## 2020-06-28 ENCOUNTER — Ambulatory Visit: Payer: Medicare Other | Admitting: Internal Medicine

## 2020-06-28 ENCOUNTER — Encounter: Payer: Self-pay | Admitting: Internal Medicine

## 2020-06-28 VITALS — BP 130/78 | HR 70 | Resp 18 | Ht 67.0 in | Wt 201.4 lb

## 2020-06-28 DIAGNOSIS — E0500 Thyrotoxicosis with diffuse goiter without thyrotoxic crisis or storm: Secondary | ICD-10-CM | POA: Diagnosis not present

## 2020-06-28 DIAGNOSIS — E0590 Thyrotoxicosis, unspecified without thyrotoxic crisis or storm: Secondary | ICD-10-CM | POA: Diagnosis not present

## 2020-06-28 LAB — T4, FREE: Free T4: 0.88 ng/dL (ref 0.60–1.60)

## 2020-06-28 LAB — TSH: TSH: 0.29 u[IU]/mL — ABNORMAL LOW (ref 0.35–4.50)

## 2020-06-28 NOTE — Progress Notes (Signed)
Name: [name] Huff  MRN/ DOB: [id number], August 20, 1946    Age/ Sex: 74 y.o., male     PCP: Lillard Anes, MD   Reason for Endocrinology Evaluation: Hyperthyroidism     Initial Endocrinology Clinic Visit: 05/16/2019    PATIENT IDENTIFIER: [name] is a 74 y.o., male with a past medical history of HTN. He has followed with [facility] since 05/16/2019 for consultative assistance with management of his Hyperthyroidism   HISTORICAL SUMMARY: The patient was first diagnosed with hypothyroidism many years ago, he was  on LT-4 replacement for many years up to 150 mcg daily,  but in 2019 his dose has been gradually reduced and was off for a few months prior to his presentation to our clinic.    He was diagnosed with hyperthyroidism secondary to graves' disease in 05/2019 and started on Methimazole   Sister with thyroid disease.  SUBJECTIVE:    Today (06/28/2020):  [name] is here for a 4 month follow up on hyperthyroidism. He has been compliant with methimazole 5  mg .   Weight has been steady  He denies any nausea/abdominal pain / diarrhea  He denies any local neck symptoms  Denies eye symptoms  Sister passed away    ROS:  As per HPI.   HISTORY:  Past Medical History:  Past Medical History:  Diagnosis Date  . Asthma   . GERD (gastroesophageal reflux disease)   . Hyperlipidemia   . Hypertension   . Hypothyroidism   . 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 CATARACT EXTRACTION    . TONSILECTOMY/ADENOIDECTOMY WITH MYRINGOTOMY  2020    Social History:  reports that he has never smoked. He has never used smokeless tobacco. He reports that he does not drink alcohol and does not use drugs. Family History:  Family History  Problem Relation Age of Onset  . Thyroid disease Sister   . Goiter Paternal Grandmother   . Heart  attack Mother   . Colon cancer Father        Patient passed away unknown     HOME MEDICATIONS: Allergies as of 06/28/2020   No Known Allergies     Medication List       Accurate as of June 28, 2020  7:25 AM. If you have any questions, ask your nurse or doctor.        Albuterol Sulfate (sensor) 108 (90 Base) MCG/ACT Aepb Inhale 2 puffs into the lungs in the morning, at noon, in the evening, and at bedtime. Patient uses proair   Allegra Allergy 180 MG tablet Generic drug: fexofenadine Take 180 mg by mouth daily.   lisinopril 20 MG tablet Commonly known as: ZESTRIL TAKE ONE TABLET BY MOUTH EVERY DAY   methimazole 5 MG tablet Commonly known as: TAPAZOLE Take 1 tablet (5 mg total) by mouth as directed. 1 tablet Mon- Thursday Half a tablet Friday- Sunday   montelukast 10 MG tablet Commonly known as: SINGULAIR Take 1 tablet (10 mg total) by mouth at bedtime.   omeprazole 40 MG capsule Commonly known as: PRILOSEC TAKE 1 CAPSULE BY MOUTH EVERY DAY         OBJECTIVE:   PHYSICAL EXAM: VS: There were no vitals taken for this visit.   EXAM: General: Pt appears well and is in NAD  Neck: General: Supple without adenopathy. Thyroid: Thyroid size normal.  No goiter or nodules appreciated. No thyroid bruit.  Lungs: Clear with good BS bilat with no rales, rhonchi, or wheezes  Heart: Auscultation: RRR.  Abdomen: Normoactive bowel sounds, soft, nontender, without masses or organomegaly palpable  Extremities:  BL LE: No pretibial edema normal ROM and strength.  Mental Status: Judgment, insight: Intact Orientation: Oriented to time, place, and person Mood and affect: No depression, anxiety, or agitation     DATA REVIEWED:  Results for LLEWELYN, SHEAFFER (MRN [id number]) as of 06/29/2020 18:04  Ref. Range 06/28/2020 10:00  TSH Latest Ref Range: 0.35 - 4.50 uIU/mL 0.29 (L)  T4,Free(Direct) Latest Ref Range: 0.60 - 1.60 ng/dL 0.88      Results for KIM, LAUVER  (MRN [id number]) as of 08/16/2019 13:51  Ref. Range 05/16/2019 15:04  TRAB Latest Ref Range: <=2.00 IU/L >40.00 (H)   ASSESSMENT / PLAN / RECOMMENDATIONS:   1. Hyperthyroidism Secondary To Graves' Disease:   - Pt with H[place]-Toxi given his prior diagnosis of hypothyroidism  - He is clinically euthyroid  - No side effects to methimazole  - Repeat TFT's today show subclinical Hyperthyroidism  , we attempted to reduce the dose at least twice in the past few months but his TSH decreased. We discussed long term treatment with RAI ablation or surgery.   Medications  Increase Methimazole 5 mg,1.5 tabs daily   2. Graves' Disease:    - No extra-thyroidal manifestations of graves' disease - Up to date on eye exams    F/U in 4 months    Addendum: Left a message on 06/29/2020 at 1600  Signed electronically by: Mack Guise, MD  [facility]  [facility] [address], [place] [address] Phone: [phone] FAX: [phone]      CC: Lillard Anes, MD [address] [place] [address] Phone: [phone]  Fax: [phone]   Return to Endocrinology clinic as below: Future Appointments  Date Time Provider [place]  06/28/2020  9:30 AM Daphney Hopke, Melanie Crazier, MD [facility] None

## 2020-06-28 NOTE — Patient Instructions (Signed)
-   Please continue methimazole 5 mg , 1 tablet daily

## 2020-06-29 MED ORDER — METHIMAZOLE 5 MG PO TABS: 7.5000 mg | ORAL_TABLET | Freq: Every day | ORAL | 6 refills | Status: DC

## 2020-07-13 ENCOUNTER — Ambulatory Visit: Payer: Medicare Other | Admitting: Internal Medicine

## 2020-08-15 ENCOUNTER — Telehealth: Payer: Self-pay | Admitting: Internal Medicine

## 2020-08-15 NOTE — Telephone Encounter (Signed)
F/u  Per patient calling back   [phone] - home phone number leave a voice mail regarding previous messages.

## 2020-08-15 NOTE — Telephone Encounter (Signed)
Rx Methamizole 7.5 mgTake 1.5 tablets by mouth daily. 1 tablet Mon- Thursday Half a tablet Friday- Sunday---Bottle instructions  Last OV instruction --methimazole 7.5 mg --take 1.5  tablet daily  Pt confused which instruction . Please advise

## 2020-08-15 NOTE — Telephone Encounter (Signed)
Patient called stating he was misunderstood on the directions for his methimazole and he's been taking the whole tablet (7.5) everyday and has not been breaking it in half like the directions say. He just now realized it when he went to pick his refill up that it said to take half Friday-Sunday. He would like to be called back to let him know if this is "ok" - ph# [phone]

## 2020-08-16 ENCOUNTER — Telehealth: Payer: Self-pay

## 2020-08-16 NOTE — Telephone Encounter (Signed)
F/U   Patient is asking for a call back on medication teaching.   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

## 2020-08-16 NOTE — Telephone Encounter (Signed)
F/u   The patient is asking for a call back on yesterday message - medication

## 2020-08-17 NOTE — Telephone Encounter (Signed)
Pt has followed provider instructions take one and a half tablets every day total 5 & a half mg per day.  Instructions on bottle are different.  Please call before 10:00 am TODAY 08/17/20 on cell [phone].

## 2020-08-20 NOTE — Telephone Encounter (Signed)
Patient was given information and does not have any additional questions at this time

## 2020-08-20 NOTE — Telephone Encounter (Signed)
Left a vm for patient to callback to advise of how he should be taking the Methimazole as follows: Methimazole comes in 5 mg, so he needs to be taking ONE  and a HALF tablets daily

## 2020-08-20 NOTE — Telephone Encounter (Signed)
Patient called back - informed him of what Morey Hummingbird stated on her last note. Sending him to [place] just in case he had additional questions.

## 2020-08-27 ENCOUNTER — Telehealth (INDEPENDENT_AMBULATORY_CARE_PROVIDER_SITE_OTHER): Payer: Medicare Other | Admitting: Legal Medicine

## 2020-08-27 ENCOUNTER — Encounter: Payer: Self-pay | Admitting: Legal Medicine

## 2020-08-27 VITALS — BP 117/77 | HR 86 | Temp 97.7°F | Ht 67.0 in | Wt 201.0 lb

## 2020-08-27 DIAGNOSIS — J0100 Acute maxillary sinusitis, unspecified: Secondary | ICD-10-CM | POA: Diagnosis not present

## 2020-08-27 MED ORDER — AMOXICILLIN-POT CLAVULANATE 875-125 MG PO TABS: 1.0000 | ORAL_TABLET | Freq: Two times a day (BID) | ORAL | 0 refills | Status: DC

## 2020-08-27 MED ORDER — PREDNISONE 10 MG (21) PO TBPK: ORAL_TABLET | ORAL | 0 refills | Status: DC

## 2020-08-27 NOTE — Progress Notes (Signed)
Virtual Visit via Telephone Note   This visit type was conducted due to national recommendations for restrictions regarding the COVID-19 Pandemic (e.g. social distancing) in an effort to limit this patient's exposure and mitigate transmission in our community.  Due to his co-morbid illnesses, this patient is at least at moderate risk for complications without adequate follow up.  This format is felt to be most appropriate for this patient at this time.  The patient did not have access to video technology/had technical difficulties with video requiring transitioning to audio format only (telephone).  All issues noted in this document were discussed and addressed.  No physical exam could be performed with this format.  Patient verbally consented to a telehealth visit.   Date:  08/27/2020   ID:  [name] Huff, DOB 1945-12-27, MRN [id number]  Patient Location: Home Provider Location: Office/Clinic  PCP:  Lillard Anes, MD   Evaluation Performed:  New Patient Evaluation  Chief Complaint:  Sinus infection   History of Present Illness:    [name] Huff is a 74 y.o. male with sinus infection for one week, cough with sinus pain, congestion  The patient does not have symptoms concerning for COVID-19 infection (fever, chills, cough, or new shortness of breath).    Past Medical History:  Diagnosis Date  . Asthma   . GERD (gastroesophageal reflux disease)   . Hyperlipidemia   . Hypertension   . Hypothyroidism   . 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 CATARACT EXTRACTION    . TONSILECTOMY/ADENOIDECTOMY WITH MYRINGOTOMY  2020    Family History  Problem Relation Age of Onset  . Thyroid disease Sister   . Goiter Paternal Grandmother   . Heart attack Mother   . Colon cancer Father        Patient passed away unknown    Social History   Socioeconomic History  . Marital status:  Legally Separated    Spouse name: Not on file  . Number of children: 0  . Years of education: Not on file  . Highest education level: Not on file  Occupational History  . 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 Smoking status: Never Smoker  . Smokeless tobacco: Never Used  Substance and Sexual Activity  . Alcohol use: Never  . Drug use: Never  . Sexual activity: Yes    Partners: Female  Other Topics Concern  . Not on file  Social History Narrative  . Not on file   Social Determinants of Health   Financial Resource Strain:   . Difficulty of Paying Living Expenses: Not on file  Food Insecurity:   . Worried About Charity fundraiser in the Last Year: Not on file  . Ran Out of Food in the Last Year: Not on file  Transportation Needs:   . Lack of Transportation (Medical): Not on file  . Lack of Transportation (Non-Medical): Not on file  Physical Activity:   . Days of Exercise per Week: Not on file  . Minutes of Exercise per Session: Not on file  Stress:   . Feeling of Stress : Not on file  Social Connections:   . Frequency of Communication with Friends and Family: Not on file  . Frequency of Social Gatherings with Friends and Family: Not on file  . Attends Religious Services: Not on file  . Active  Member of Clubs or Organizations: Not on file  . Attends Archivist Meetings: Not on file  . Marital Status: Not on file  Intimate Partner Violence:   . Fear of Current or Ex-Partner: Not on file  . Emotionally Abused: Not on file  . Physically Abused: Not on file  . Sexually Abused: Not on file    Outpatient Medications Prior to Visit  Medication Sig Dispense Refill  . Albuterol Sulfate, sensor, 108 (90 Base) MCG/ACT AEPB Inhale 2 puffs into the lungs in the morning, at noon, in the evening, and at bedtime. Patient uses proair 1 each 6  . fexofenadine (ALLEGRA ALLERGY) 180 MG tablet Take 180 mg by mouth daily.    Marland Kitchen  lisinopril (ZESTRIL) 20 MG tablet TAKE ONE TABLET BY MOUTH EVERY DAY 90 tablet 2  . methimazole (TAPAZOLE) 5 MG tablet Take 1.5 tablets (7.5 mg total) by mouth daily. 1 tablet Mon- Thursday Half a tablet Friday- Sunday 45 tablet 6  . omeprazole (PRILOSEC) 40 MG capsule TAKE 1 CAPSULE BY MOUTH EVERY DAY 90 capsule 2   No facility-administered medications prior to visit.    Allergies:   Patient has no known allergies.   Social History   Tobacco Use  . Smoking status: Never Smoker  . Smokeless tobacco: Never Used  Substance Use Topics  . Alcohol use: Never  . Drug use: Never     Review of Systems  Constitutional: Negative for chills and fever.  HENT: Positive for congestion and sinus pain.   Eyes: Negative.   Respiratory: Positive for cough. Negative for hemoptysis and sputum production.   Cardiovascular: Negative for orthopnea, claudication and leg swelling.  Gastrointestinal: Negative.   Genitourinary: Negative.   Musculoskeletal: Negative.   Neurological: Negative.   Psychiatric/Behavioral: Negative.      Labs/Other Tests and Data Reviewed:    Recent Labs: 06/11/2020: ALT 15; BUN 12; Creatinine, Ser 1.01; Hemoglobin 16.3; Platelets 249; Potassium 4.6; Sodium 137 06/28/2020: TSH 0.29   Recent Lipid Panel No results found for: CHOL, TRIG, HDL, CHOLHDL, LDLCALC, LDLDIRECT  Wt Readings from Last 3 Encounters:  08/27/20 201 lb (91.2 kg)  06/28/20 201 lb 6.4 oz (91.4 kg)  06/11/20 202 lb (91.6 kg)     Objective:    Vital Signs:  BP 117/77   Pulse 86   Temp 97.7 F (36.5 C)   Ht 5\' 7"  (1.702 m)   Wt 201 lb (91.2 kg)   BMI 31.48 kg/m    Physical Exam vs reviewed  ASSESSMENT & PLAN:   1. Acute non-recurrent maxillary sinusitis - amoxicillin-clavulanate (AUGMENTIN) 875-125 MG tablet; Take 1 tablet by mouth 2 (two) times daily.  Dispense: 20 tablet; Refill: 0 - predniSONE (STERAPRED UNI-PAK 21 TAB) 10 MG (21) TBPK tablet; Take 6ills first day , then 5 pills day 2 and  then cut down one pill day until gone  Dispense: 21 tablet; Refill: 0  Patient is having sinus infection, treat with augmentin and prednisone pack     Meds ordered this encounter  Medications  . amoxicillin-clavulanate (AUGMENTIN) 875-125 MG tablet    Sig: Take 1 tablet by mouth 2 (two) times daily.    Dispense:  20 tablet    Refill:  0  . predniSONE (STERAPRED UNI-PAK 21 TAB) 10 MG (21) TBPK tablet    Sig: Take 6ills first day , then 5 pills day 2 and then cut down one pill day until gone    Dispense:  21 tablet  Refill:  0    COVID-19 Education: The signs and symptoms of COVID-19 were discussed with the patient and how to seek care for testing (follow up with PCP or arrange E-visit). The importance of social distancing was discussed today.  Time:   Today, I have spent 20 minutes with the patient with telehealth technology discussing the above problems.    Follow Up:  In Person prn  Signed, Reinaldo Meeker, MD  08/27/2020 6:38 PM    [place]

## 2020-09-07 ENCOUNTER — Other Ambulatory Visit: Payer: Self-pay

## 2020-09-07 ENCOUNTER — Ambulatory Visit (INDEPENDENT_AMBULATORY_CARE_PROVIDER_SITE_OTHER): Payer: Medicare Other

## 2020-09-07 DIAGNOSIS — Z23 Encounter for immunization: Secondary | ICD-10-CM

## 2020-09-14 ENCOUNTER — Other Ambulatory Visit: Payer: Self-pay

## 2020-09-14 MED ORDER — METHIMAZOLE 5 MG PO TABS
ORAL_TABLET | ORAL | 2 refills | Status: DC
Start: 2020-09-14 — End: 2020-12-04

## 2020-10-05 DIAGNOSIS — R051 Acute cough: Secondary | ICD-10-CM | POA: Diagnosis not present

## 2020-10-05 DIAGNOSIS — J302 Other seasonal allergic rhinitis: Secondary | ICD-10-CM | POA: Diagnosis not present

## 2020-10-05 DIAGNOSIS — R062 Wheezing: Secondary | ICD-10-CM | POA: Diagnosis not present

## 2020-10-18 ENCOUNTER — Telehealth (INDEPENDENT_AMBULATORY_CARE_PROVIDER_SITE_OTHER): Payer: Medicare Other | Admitting: Legal Medicine

## 2020-10-18 ENCOUNTER — Encounter: Payer: Self-pay | Admitting: Legal Medicine

## 2020-10-18 VITALS — BP 119/76 | HR 91 | Temp 97.9°F | Ht 67.0 in | Wt 205.0 lb

## 2020-10-18 DIAGNOSIS — J0100 Acute maxillary sinusitis, unspecified: Secondary | ICD-10-CM | POA: Diagnosis not present

## 2020-10-18 MED ORDER — AMOXICILLIN-POT CLAVULANATE 875-125 MG PO TABS: 1.0000 | ORAL_TABLET | Freq: Two times a day (BID) | ORAL | 0 refills | Status: DC

## 2020-10-18 NOTE — Progress Notes (Addendum)
Virtual Visit via Telephone Note   This visit type was conducted due to national recommendations for restrictions regarding the COVID-19 Pandemic (e.g. social distancing) in an effort to limit this patient's exposure and mitigate transmission in our community.  Due to his co-morbid illnesses, this patient is at least at moderate risk for complications without adequate follow up.  This format is felt to be most appropriate for this patient at this time.  The patient did not have access to video technology/had technical difficulties with video requiring transitioning to audio format only (telephone).  All issues noted in this document were discussed and addressed.  No physical exam could be performed with this format.  Patient verbally consented to a telehealth visit.   Date:  10/18/2020   ID:  [name] Huff, DOB Jul 29, 1946, MRN [id number]  Patient Location: Home Provider Location: Office/Clinic  PCP:  Lillard Anes, MD   Evaluation Performed:  New Patient Evaluation  Chief Complaint:  4 days of cough, no fever  History of Present Illness:    [name] Huff is a 74 y.o. male with 4 days of cough, no fever patient was mowing the lawn and raking leaves last week he got very congested and went to urgent care and was given steroid pack which helped a bit but he is now having more sinus pain no fever chills just minimally nonproductive cough.  He has not been exposed to any Covid. He is having no wheezing so he has not used his albuterol.    The patient does not have symptoms concerning for COVID-19 infection (fever, chills, cough, or new shortness of breath).    Past Medical History:  Diagnosis Date  . Asthma   . GERD (gastroesophageal reflux disease)   . Hyperlipidemia   . Hypertension   . 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 CATARACT EXTRACTION    . TONSILECTOMY/ADENOIDECTOMY  WITH MYRINGOTOMY  2020    Family History  Problem Relation Age of Onset  . Thyroid disease Sister   . Goiter Paternal Grandmother   . Heart attack Mother   . Colon cancer Father        Patient passed away unknown    Social History   Socioeconomic History  . Marital status: Legally Separated    Spouse name: Not on file  . Number of children: 0  . Years of education: Not on file  . Highest education level: Not on file  Occupational History  . 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[employer]  Tobacco Use  . Smoking status: Never Smoker  . Smokeless tobacco: Never Used  Substance and Sexual Activity  . Alcohol use: Never  . Drug use: Never  . Sexual activity: Yes    Partners: Female  Other Topics Concern  . Not on file  Social History Narrative  . Not on file   Social Determinants of Health   Financial Resource Strain: Not on file  Food Insecurity: Not on file  Transportation Needs: Not on file  Physical Activity: Not on file  Stress: Not on file  Social Connections: Not on file  Intimate Partner Violence: Not on file    Outpatient Medications Prior to Visit  Medication Sig Dispense Refill  . Albuterol Sulfate, sensor, 108 (90 Base) MCG/ACT AEPB Inhale 2 puffs into the lungs in the morning, at noon, in the evening, and at bedtime. Patient  uses proair 1 each 6  . fexofenadine (ALLEGRA) 180 MG tablet Take 180 mg by mouth daily.    Marland Kitchen lisinopril (ZESTRIL) 20 MG tablet TAKE ONE TABLET BY MOUTH EVERY DAY 90 tablet 2  . methimazole (TAPAZOLE) 5 MG tablet Take 1.5 tablets daily 135 tablet 2  . omeprazole (PRILOSEC) 40 MG capsule TAKE 1 CAPSULE BY MOUTH EVERY DAY 90 capsule 2  . amoxicillin-clavulanate (AUGMENTIN) 875-125 MG tablet Take 1 tablet by mouth 2 (two) times daily. 20 tablet 0  . predniSONE (STERAPRED UNI-PAK 21 TAB) 10 MG (21) TBPK tablet Take 6ills first day , then 5 pills day 2 and then cut down one pill day until gone 21 tablet 0   No  facility-administered medications prior to visit.    Allergies:   Patient has no known allergies.   Social History   Tobacco Use  . Smoking status: Never Smoker  . Smokeless tobacco: Never Used  Substance Use Topics  . Alcohol use: Never  . Drug use: Never     Review of Systems  Constitutional: Negative for chills and fever.  HENT: Positive for congestion and sinus pain. Negative for hearing loss and tinnitus.   Eyes: Negative for redness.  Respiratory: Positive for cough. Negative for sputum production.   Cardiovascular: Negative for chest pain, palpitations and orthopnea.  Genitourinary: Negative for dysuria, frequency and urgency.  Musculoskeletal: Negative for myalgias.  Skin: Negative for itching and rash.  Neurological: Positive for headaches. Negative for dizziness.  Psychiatric/Behavioral: Negative.      Labs/Other Tests and Data Reviewed:    Recent Labs: 06/11/2020: ALT 15; BUN 12; Creatinine, Ser 1.01; Hemoglobin 16.3; Platelets 249; Potassium 4.6; Sodium 137 06/28/2020: TSH 0.29   Recent Lipid Panel No results found for: CHOL, TRIG, HDL, CHOLHDL, LDLCALC, LDLDIRECT  Wt Readings from Last 3 Encounters:  10/18/20 205 lb (93 kg)  08/27/20 201 lb (91.2 kg)  06/28/20 201 lb 6.4 oz (91.4 kg)     Objective:    Vital Signs:  BP 119/76   Pulse 91   Temp 97.9 F (36.6 C)   Ht 5\' 7"  (1.702 m)   Wt 205 lb (93 kg)   SpO2 98%   BMI 32.11 kg/m    Physical Exam reviewed  ASSESSMENT & PLAN:   Diagnoses and all orders for this visit: Acute non-recurrent maxillary sinusitis -     amoxicillin-clavulanate (AUGMENTIN) 875-125 MG tablet; Take 1 tablet by mouth 2 (two) times daily. Treat with his nasonex nose spray and augmentin.  Follow up if any fever or getting worse.  No orders of the defined types were placed in this encounter.    Meds ordered this encounter  Medications  . amoxicillin-clavulanate (AUGMENTIN) 875-125 MG tablet    Sig: Take 1 tablet by mouth 2  (two) times daily.    Dispense:  20 tablet    Refill:  0    COVID-19 Education: The signs and symptoms of COVID-19 were discussed with the patient and how to seek care for testing (follow up with PCP or arrange E-visit). The importance of social distancing was discussed today.   I spent 15 minutes dedicated to the care of this patient on the date of this encounter to include face-to-face time with the patient, as well as:   Follow Up:  In Person prn  Signed,  Reinaldo Meeker, MD  10/18/2020 11:25 AM    [place]

## 2020-10-22 ENCOUNTER — Other Ambulatory Visit (INDEPENDENT_AMBULATORY_CARE_PROVIDER_SITE_OTHER): Payer: Medicare Other

## 2020-10-22 DIAGNOSIS — R059 Cough, unspecified: Secondary | ICD-10-CM

## 2020-10-22 DIAGNOSIS — R430 Anosmia: Secondary | ICD-10-CM

## 2020-10-22 LAB — POC COVID19 BINAXNOW: SARS Coronavirus 2 Ag: POSITIVE — AB

## 2020-10-22 NOTE — Progress Notes (Addendum)
Patient Name: [name] Huff Date of Birth: 05-Jan-1946 MRN:  [id number]  EULISES KIJOWSKI is a 74 y.o. yo male presenting for COVID-19 testing.  He/She is being tested from the vehicle.  ARVIE BARTHOLOMEW is being tested due to cough and loss of smell.  Results for orders placed or performed in visit on 10/22/20 (from the past 24 hour(s))  POC COVID-19     Status: Abnormal   Collection Time: 10/22/20  2:40 PM  Result Value Ref Range   SARS Coronavirus 2 Ag Positive (A) Negative   Patient aware of results, he would like the antibody infusion.  Erie Noe, LPN 2:17 PM

## 2020-10-22 NOTE — Addendum Note (Signed)
Addended by: Thompson Caul I on: 10/22/2020 02:51 PM   Modules accepted: Orders

## 2020-10-23 ENCOUNTER — Ambulatory Visit (HOSPITAL_COMMUNITY)
Admission: RE | Admit: 2020-10-23 | Discharge: 2020-10-23 | Disposition: A | Discharge status: Home / Self Care | Payer: Medicare Other | Source: Ambulatory Visit | Attending: Pulmonary Disease | Admitting: Pulmonary Disease

## 2020-10-23 ENCOUNTER — Other Ambulatory Visit: Payer: Self-pay | Admitting: Nurse Practitioner

## 2020-10-23 DIAGNOSIS — Z6831 Body mass index (BMI) 31.0-31.9, adult: Secondary | ICD-10-CM | POA: Diagnosis present

## 2020-10-23 DIAGNOSIS — J4521 Mild intermittent asthma with (acute) exacerbation: Secondary | ICD-10-CM | POA: Diagnosis present

## 2020-10-23 DIAGNOSIS — I10 Essential (primary) hypertension: Secondary | ICD-10-CM

## 2020-10-23 DIAGNOSIS — U071 COVID-19: Secondary | ICD-10-CM

## 2020-10-23 DIAGNOSIS — Z23 Encounter for immunization: Secondary | ICD-10-CM | POA: Insufficient documentation

## 2020-10-23 MED ORDER — METHYLPREDNISOLONE SODIUM SUCC 125 MG IJ SOLR: 125.0000 mg | Freq: Once | INTRAMUSCULAR | Status: DC | PRN

## 2020-10-23 MED ORDER — FAMOTIDINE IN NACL 20-0.9 MG/50ML-% IV SOLN: 20.0000 mg | Freq: Once | INTRAVENOUS | Status: DC | PRN

## 2020-10-23 MED ORDER — DIPHENHYDRAMINE HCL 50 MG/ML IJ SOLN: 50.0000 mg | Freq: Once | INTRAMUSCULAR | Status: DC | PRN

## 2020-10-23 MED ORDER — ALBUTEROL SULFATE HFA 108 (90 BASE) MCG/ACT IN AERS: 2.0000 | INHALATION_SPRAY | Freq: Once | RESPIRATORY_TRACT | Status: DC | PRN

## 2020-10-23 MED ORDER — EPINEPHRINE 0.3 MG/0.3ML IJ SOAJ: 0.3000 mg | Freq: Once | INTRAMUSCULAR | Status: DC | PRN

## 2020-10-23 MED ORDER — SODIUM CHLORIDE 0.9 % IV SOLN: Freq: Once | INTRAVENOUS | Status: AC

## 2020-10-23 MED ORDER — SODIUM CHLORIDE 0.9 % IV SOLN: INTRAVENOUS | Status: DC | PRN

## 2020-10-23 NOTE — Progress Notes (Signed)
Patient reviewed Fact Sheet for Patients, Parents, and Caregivers for Emergency Use Authorization (EUA) of Bam/Ete for the Treatment of Coronavirus. Patient also reviewed and is agreeable to the estimated cost of treatment. Patient is agreeable to proceed.

## 2020-10-23 NOTE — Progress Notes (Signed)
  Diagnosis: COVID-19  Physician: Dr. Joya Gaskins  Procedure: Covid Infusion Clinic Med: bamlanivimab\etesevimab infusion - Provided patient with bamlanimivab\etesevimab fact sheet for patients, parents and caregivers prior to infusion.  Complications: No immediate complications noted.  Discharge: Discharged home   [name] Huff 10/23/2020

## 2020-10-23 NOTE — Progress Notes (Signed)
I connected by phone with [name] Huff on 10/23/2020 at 11:18 AM to discuss the potential use of a new treatment for mild to moderate COVID-19 viral infection in non-hospitalized patients.  This patient is a 74 y.o. male that meets the FDA criteria for Emergency Use Authorization of COVID monoclonal antibody casirivimab/imdevimab, bamlanivimab/eteseviamb, or sotrovimab.  Has a (+) direct SARS-CoV-2 viral test result  Has mild or moderate COVID-19   Is NOT hospitalized due to COVID-19  Is within 10 days of symptom onset  Has at least one of the high risk factor(s) for progression to severe COVID-19 and/or hospitalization as defined in EUA.  Specific high risk criteria : Older age (>/= 75 yo), BMI > 25 and Cardiovascular disease or hypertension   I have spoken and communicated the following to the patient or parent/caregiver regarding COVID monoclonal antibody treatment:  1. FDA has authorized the emergency use for the treatment of mild to moderate COVID-19 in adults and pediatric patients with positive results of direct SARS-CoV-2 viral testing who are 30 years of age and older weighing at least 40 kg, and who are at high risk for progressing to severe COVID-19 and/or hospitalization.  2. The significant known and potential risks and benefits of COVID monoclonal antibody, and the extent to which such potential risks and benefits are unknown.  3. Information on available alternative treatments and the risks and benefits of those alternatives, including clinical trials.  4. Patients treated with COVID monoclonal antibody should continue to self-isolate and use infection control measures (e.g., wear mask, isolate, social distance, avoid sharing personal items, clean and disinfect "high touch" surfaces, and frequent handwashing) according to CDC guidelines.   5. The patient or parent/caregiver has the option to accept or refuse COVID monoclonal antibody treatment.  After reviewing this  information with the patient, the patient has agreed to receive one of the available covid 19 monoclonal antibodies and will be provided an appropriate fact sheet prior to infusion. Jobe Gibbon, NP 10/23/2020 11:18 AM

## 2020-10-23 NOTE — Discharge Instructions (Signed)
10 Things You Can Do to Manage Your COVID-19 Symptoms at Home If you have possible or confirmed COVID-19: 1. Stay home from work and school. And stay away from other public places. If you must go out, avoid using any kind of public transportation, ridesharing, or taxis. 2. Monitor your symptoms carefully. If your symptoms get worse, call your healthcare provider immediately. 3. Get rest and stay hydrated. 4. If you have a medical appointment, call the healthcare provider ahead of time and tell them that you have or may have COVID-19. 5. For medical emergencies, call 911 and notify the dispatch personnel that you have or may have COVID-19. 6. Cover your cough and sneezes with a tissue or use the inside of your elbow. 7. Wash your hands often with soap and water for at least 20 seconds or clean your hands with an alcohol-based hand sanitizer that contains at least 60% alcohol. 8. As much as possible, stay in a specific room and away from other people in your home. Also, you should use a separate bathroom, if available. If you need to be around other people in or outside of the home, wear a mask. 9. Avoid sharing personal items with other people in your household, like dishes, towels, and bedding. 10. Clean all surfaces that are touched often, like counters, tabletops, and doorknobs. Use household cleaning sprays or wipes according to the label instructions. cdc.gov/coronavirus 05/11/2019 This information is not intended to replace advice given to you by your health care provider. Make sure you discuss any questions you have with your health care provider. Document Revised: 10/13/2019 Document Reviewed: 10/13/2019 Elsevier Patient Education  2020 Elsevier Inc. What types of side effects do monoclonal antibody drugs cause?  Common side effects  In general, the more common side effects caused by monoclonal antibody drugs include: . Allergic reactions, such as hives or itching . Flu-like signs and  symptoms, including chills, fatigue, fever, and muscle aches and pains . Nausea, vomiting . Diarrhea . Skin rashes . Low blood pressure   The CDC is recommending patients who receive monoclonal antibody treatments wait at least 90 days before being vaccinated.  Currently, there are no data on the safety and efficacy of mRNA COVID-19 vaccines in persons who received monoclonal antibodies or convalescent plasma as part of COVID-19 treatment. Based on the estimated half-life of such therapies as well as evidence suggesting that reinfection is uncommon in the 90 days after initial infection, vaccination should be deferred for at least 90 days, as a precautionary measure until additional information becomes available, to avoid interference of the antibody treatment with vaccine-induced immune responses. If you have any questions or concerns after the infusion please call the Advanced Practice Provider on call at [phone]. This number is ONLY intended for your use regarding questions or concerns about the infusion post-treatment side-effects.  Please do not provide this number to others for use. For return to work notes please contact your primary care provider.   If someone you know is interested in receiving treatment please have them call the COVID hotline at [phone].   

## 2020-11-06 ENCOUNTER — Encounter: Payer: Self-pay | Admitting: Family Medicine

## 2020-11-06 ENCOUNTER — Other Ambulatory Visit: Payer: Self-pay

## 2020-11-06 ENCOUNTER — Ambulatory Visit (INDEPENDENT_AMBULATORY_CARE_PROVIDER_SITE_OTHER): Payer: Medicare Other | Admitting: Family Medicine

## 2020-11-06 VITALS — BP 112/64 | HR 76 | Resp 18 | Ht 67.0 in | Wt 202.6 lb

## 2020-11-06 DIAGNOSIS — Z0000 Encounter for general adult medical examination without abnormal findings: Secondary | ICD-10-CM

## 2020-11-06 NOTE — Patient Instructions (Signed)
  [name] Huff , Thank you for taking time to come for your Medicare Wellness Visit. I appreciate your ongoing commitment to your health goals. Please review the following plan we discussed and let me know if I can assist you in the future.   These are the goals we discussed: make eye appointment  Schedule a COVID vaccine for 3 months This is a list of the screening recommended for you and due dates:  Health Maintenance  Topic Date Due  .  Hepatitis C: One time screening is recommended by Center for Disease Control  (CDC) for  adults born from [birth year] through 1966.   Never done  . COVID-19 Vaccine (1) Never done  . Tetanus Vaccine  Never done  . Colon Cancer Screening  Never done  . Pneumonia vaccines (2 of 2 - PCV13) 12/23/2012  . Flu Shot  Completed

## 2020-11-06 NOTE — Progress Notes (Signed)
Subjective:   ELLI HUDEK is a 74 y.o. male who presents for Medicare Annual/Subsequent preventive examination.  Review of Systems    Cardiac Risk Factors include: advanced age (>55men, >32 women)     Objective:    Today's Vitals   11/06/20 0917  BP: 112/64  Pulse: 76  Resp: 18  SpO2: 96%  Weight: 202 lb 9.6 oz (91.9 kg)  Height: 5\' 7"  (1.702 m)   Body mass index is 31.73 kg/m.  Advanced Directives 11/06/2020  Does Patient Have a Medical Advance Directive? No    Current Medications (verified) Outpatient Encounter Medications as of 11/06/2020  Medication Sig  . Albuterol Sulfate, sensor, 108 (90 Base) MCG/ACT AEPB Inhale 2 puffs into the lungs in the morning, at noon, in the evening, and at bedtime. Patient uses proair  . fexofenadine (ALLEGRA) 180 MG tablet Take 180 mg by mouth daily.  . fluticasone (FLONASE) 50 MCG/ACT nasal spray Place 2 sprays into both nostrils daily.  Marland Kitchen lisinopril (ZESTRIL) 20 MG tablet TAKE ONE TABLET BY MOUTH EVERY DAY  . methimazole (TAPAZOLE) 5 MG tablet Take 1.5 tablets daily  . omeprazole (PRILOSEC) 40 MG capsule TAKE 1 CAPSULE BY MOUTH EVERY DAY  . [DISCONTINUED] amoxicillin-clavulanate (AUGMENTIN) 875-125 MG tablet Take 1 tablet by mouth 2 (two) times daily.   No facility-administered encounter medications on file as of 11/06/2020.    Allergies (verified) Patient has no known allergies.   History: Past Medical History:  Diagnosis Date  . Asthma   . GERD (gastroesophageal reflux disease)   . Hyperlipidemia   . Hypertension   . 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 CATARACT EXTRACTION    . TONSILECTOMY/ADENOIDECTOMY WITH MYRINGOTOMY  2020   Family History  Problem Relation Age of Onset  . Thyroid disease Sister   . Goiter Paternal Grandmother   . Heart attack Mother   . Colon cancer Father        Patient passed away unknown    Social History   Socioeconomic History  . Marital status: Legally Separated    Spouse name: Not on file  . Number of children: 0  . Years of education: Not on file  . Highest education level: Not on file  Occupational History  . 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[employer]  Tobacco Use  . Smoking status: Never Smoker  . Smokeless tobacco: Never Used  Substance and Sexual Activity  . Alcohol use: Never  . Drug use: Never  . Sexual activity: Yes    Partners: Female  Other Topics Concern  . Not on file  Social History Narrative  . Not on file   Social Determinants of Health   Financial Resource Strain: Not on file  Food Insecurity: Not on file  Transportation Needs: Not on file  Physical Activity: Not on file  Stress: Not on file  Social Connections: Not on file    Tobacco Counseling Counseling given: Not Answered   Clinical Intake:  Pre-visit preparation completed: Yes  Pain : No/denies pain     Nutritional Risks: None  How often do you need to have someone help you when you read instructions, pamphlets, or other written materials from your doctor or pharmacy?: 1 - Never What is the last grade level you completed in school?: 11   Interpreter Needed?: No      Activities of Daily Living In your present  state of health, do you have any difficulty performing the following activities: 11/06/2020  Hearing? Y  Vision? N  Difficulty concentrating or making decisions? N  Walking or climbing stairs? N  Dressing or bathing? N  Doing errands, shopping? N  Preparing Food and eating ? N  Using the Toilet? N  In the past six months, have you accidently leaked urine? N  Do you have problems with loss of bowel control? N  Managing your Medications? N  Managing your Finances? N  Housekeeping or managing your Housekeeping? N  Some recent data might be hidden    Patient Care Team: Abigail Miyamoto, MD as PCP - General (Family  Medicine)    Assessment:   This is a routine wellness examination for Harjot.  Hearing/Vision screen  Hearing Screening   125Hz  250Hz  500Hz  1000Hz  2000Hz  3000Hz  4000Hz  6000Hz  8000Hz   Right ear:           Left ear:             Visual Acuity Screening   Right eye Left eye Both eyes  Without correction: 20/40 20/50 20/32   With correction:       Dietary issues and exercise activities discussed: Current Exercise Habits: The patient does not participate in regular exercise at present Depression Screen PHQ 2/9 Scores 11/06/2020 06/11/2020 01/26/2020  PHQ - 2 Score 0 0 0    Fall Risk Fall Risk  11/06/2020 06/11/2020 06/11/2020  Falls in the past year? 0 0 0  Number falls in past yr: 0 0 -  Injury with Fall? 0 0 -  Risk for fall due to : No Fall Risks - -  Follow up - Falls evaluation completed -    FALL RISK PREVENTION PERTAINING TO THE HOME:  Any stairs in or around the home? No stairs If so, are there any without handrails? No basement Home free of loose throw rugs in walkways, pet beds, electrical cords, etc? no concerns Adequate lighting in your home to reduce risk of falls? No concerns Outside cats ASSISTIVE DEVICES UTILIZED TO PREVENT FALLS:  Life alert none Use of a cane, walker or w/c? No cane/walker Grab bars in the bathroom? No  Shower chair or bench in shower? no Elevated toilet seat or a handicapped toilet?  no  Cognitive Function: CIT 6     6CIT Screen 11/06/2020  What Year? 0 points  What month? 0 points  What time? 0 points  Count back from 20 0 points  Months in reverse 0 points  Repeat phrase 6 points  Total Score 6    Immunizations Immunization History  Administered Date(s) Administered  . Fluad Quad(high Dose 65+) 09/07/2020  . Pneumococcal Polysaccharide-23 12/24/2011    NO COVID vaccine-COVID disease- 2 weeks-pt feeling well Qualifies for Shingles Vaccine? 1 shot Screening Tests Health Maintenance  Topic Date Due  . Hepatitis C Screening   Never done  . COVID-19 Vaccine (1) Never done  . TETANUS/TDAP  Never done  . COLONOSCOPY (Pts 45-8yrs Insurance coverage will need to be confirmed)  Never done  . PNA vac Low Risk Adult (2 of 2 - PCV13) 12/23/2012  . INFLUENZA VACCINE  Completed    Health Maintenance  Health Maintenance Due  Topic Date Due  . Hepatitis C Screening  Never done  . COVID-19 Vaccine (1) Never done  . TETANUS/TDAP  Never done  . COLONOSCOPY (Pts 45-51yrs Insurance coverage will need to be confirmed)  Never done  . PNA vac Low Risk  Adult (2 of 2 - PCV13) 12/23/2012     Lung Cancer Screening: never on a regular basis   Additional Screening:  Hepatitis C Screening: no screening  Vision Screening: Recommended annual ophthalmology exams for early detection of glaucoma and other disorders of the eye. Is the patient up to date with their annual eye exam?  no-pt to make an appointment Who is the provider or what is the name of the office in which the patient attends annual eye exams? [facility]: Recommended annual dental exams for proper oral hygiene  Pt seen on a regular basis 1. Medicare annual wellness visit, subsequent Pt declines colonoscopy cologuard in the past-negative in the past, colonoscopy 20 years ago    Plan:     I have personally reviewed and noted the following in the patient's chart:   . Medical and social history . Use of alcohol, tobacco or illicit drugs  . Current medications and supplements . Functional ability and status . Nutritional status . Physical activity . Advanced directives . List of other physicians . Hospitalizations, surgeries, and ER visits in previous 12 months . Vitals . Screenings to include cognitive, depression, and falls . Referrals and appointments  In addition, I have reviewed and discussed with patient certain preventive protocols, quality metrics, and best practice recommendations. A written personalized care  plan for preventive services as well as general preventive health recommendations were provided to patient.     Mertha Baars, MD   11/06/2020

## 2020-11-07 DIAGNOSIS — H43393 Other vitreous opacities, bilateral: Secondary | ICD-10-CM | POA: Diagnosis not present

## 2020-11-07 DIAGNOSIS — H04123 Dry eye syndrome of bilateral lacrimal glands: Secondary | ICD-10-CM | POA: Diagnosis not present

## 2020-11-26 ENCOUNTER — Ambulatory Visit: Payer: Medicare Other | Admitting: Internal Medicine

## 2020-12-03 ENCOUNTER — Ambulatory Visit: Payer: Medicare Other | Admitting: Internal Medicine

## 2020-12-03 ENCOUNTER — Encounter: Payer: Self-pay | Admitting: Internal Medicine

## 2020-12-03 ENCOUNTER — Other Ambulatory Visit: Payer: Self-pay

## 2020-12-03 VITALS — BP 124/78 | HR 84 | Ht 67.0 in | Wt 205.1 lb

## 2020-12-03 DIAGNOSIS — E0590 Thyrotoxicosis, unspecified without thyrotoxic crisis or storm: Secondary | ICD-10-CM

## 2020-12-03 LAB — TSH: TSH: 8.29 u[IU]/mL — ABNORMAL HIGH (ref 0.35–4.50)

## 2020-12-03 LAB — T4, FREE: Free T4: 0.64 ng/dL (ref 0.60–1.60)

## 2020-12-03 NOTE — Progress Notes (Signed)
Name: [name] Huff  MRN/ DOB: [id number], 1946-11-01    Age/ Sex: 75 y.o., male     PCP: Lillard Anes, MD   Reason for Endocrinology Evaluation: Hyperthyroidism     Initial Endocrinology Clinic Visit: 05/16/2019    PATIENT IDENTIFIER: [name] Huff is a 75 y.o., male with a past medical history of HTN. He has followed with [facility] since 05/16/2019 for consultative assistance with management of his Hyperthyroidism      HISTORICAL SUMMARY: The patient was first diagnosed with hypothyroidism many years ago, he was  on LT-4 replacement for many years up to 150 mcg daily,  but in 2019 his dose has been gradually reduced and was off for a few months prior to his presentation to our clinic.    He was diagnosed with hyperthyroidism secondary to graves' disease in 05/2019 and started on Methimazole     Sister with thyroid disease.  SUBJECTIVE:    Today (12/03/2020):  [name] Huff is here for a 4 month follow up on hyperthyroidism. He has been compliant with methimazole 5  mg , 1.5 tabs daily    Weight has been stable  He denies any nausea/abdominal pain / diarrhea  He denies any local neck symptoms      HISTORY:  Past Medical History:  Past Medical History:  Diagnosis Date  . Asthma   . GERD (gastroesophageal reflux disease)   . Hyperlipidemia   . Hypertension   . 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 CATARACT EXTRACTION    . TONSILECTOMY/ADENOIDECTOMY WITH MYRINGOTOMY  2020    Social History:  reports that he has never smoked. He has never used smokeless tobacco. He reports that he does not drink alcohol and does not use drugs. Family History:  Family History  Problem Relation Age of Onset  . Thyroid disease Sister   . Goiter Paternal Grandmother   . Heart attack Mother   . Colon cancer Father        Patient  passed away unknown     HOME MEDICATIONS: Allergies as of 12/03/2020   No Known Allergies     Medication List       Accurate as of December 03, 2020  2:18 PM. If you have any questions, ask your nurse or doctor.        Albuterol Sulfate (sensor) 108 (90 Base) MCG/ACT Aepb Inhale 2 puffs into the lungs in the morning, at noon, in the evening, and at bedtime. Patient uses proair   fexofenadine 180 MG tablet Commonly known as: ALLEGRA Take 180 mg by mouth daily.   fluticasone 50 MCG/ACT nasal spray Commonly known as: FLONASE Place 2 sprays into both nostrils daily.   lisinopril 20 MG tablet Commonly known as: ZESTRIL TAKE ONE TABLET BY MOUTH EVERY DAY   methimazole 5 MG tablet Commonly known as: TAPAZOLE Take 1.5 tablets daily   omeprazole 40 MG capsule Commonly known as: PRILOSEC TAKE 1 CAPSULE BY MOUTH EVERY DAY         OBJECTIVE:   PHYSICAL EXAM: VS: BP 124/78   Pulse 84   Ht 5\' 7"  (1.702 m)   Wt 205 lb 2 oz (93 kg)   SpO2 98%   BMI 32.13 kg/m    EXAM: General: Pt appears well and is in NAD  Neck: General: Supple without adenopathy. Thyroid: Thyroid size normal.  No  goiter or nodules appreciated. No thyroid bruit.  Lungs: Clear with good BS bilat with no rales, rhonchi, or wheezes  Heart: Auscultation: RRR.  Abdomen: Normoactive bowel sounds, soft, nontender, without masses or organomegaly palpable  Extremities:  BL LE: No pretibial edema normal ROM and strength.  Mental Status: Judgment, insight: Intact Orientation: Oriented to time, place, and person Mood and affect: No depression, anxiety, or agitation     DATA REVIEWED:  Results for ELROY, SCHEMBRI (MRN [id number]) as of 12/04/2020 11:41  Ref. Range 12/03/2020 14:38  TSH Latest Ref Range: 0.35 - 4.50 uIU/mL 8.29 (H)  T4,Free(Direct) Latest Ref Range: 0.60 - 1.60 ng/dL 0.64    Results for MOTTY, BORIN (MRN [id number]) as of 08/16/2019 13:51  Ref. Range 05/16/2019 15:04  TRAB Latest  Ref Range: <=2.00 IU/L >40.00 (H)   ASSESSMENT / PLAN / RECOMMENDATIONS:   1. Hyperthyroidism Secondary To Graves' Disease:   - Pt with [facility] given his prior diagnosis of hypothyroidism  - He is clinically euthyroid  - No side effects to methimazole  - Repeat TFT's today show elevated TSH, will reduce the dose  - We entertained RAI ablation as a long term treatment option , but we opted to continue with methimazole at this time     Medications  Decrease Methimazole 5 mg,to 1  Tablet daily   2. Graves' Disease:    - No extra-thyroidal manifestations of graves' disease - Up to date on eye exams    F/U in 4 months   Addendum: results and instruction discussed with the pt on 12/04/2020 at 77 AM    Signed electronically by: Mack Guise, MD  [facility]  [facility], [place] [address] Phone: [phone] FAX: [phone]      CC: Lillard Anes, MD [place] [address] Phone: [phone]  Fax: [phone]   Return to Endocrinology clinic as below: No future appointments.

## 2020-12-03 NOTE — Patient Instructions (Addendum)
-   Please continue methimazole 5 mg , ONE AND A HALF  Tablets a daily

## 2020-12-04 ENCOUNTER — Telehealth: Payer: Self-pay | Admitting: Internal Medicine

## 2020-12-04 MED ORDER — METHIMAZOLE 5 MG PO TABS: 7.5000 mg | ORAL_TABLET | Freq: Every day | ORAL | 1 refills | Status: DC

## 2020-12-04 MED ORDER — METHIMAZOLE 5 MG PO TABS: 5.0000 mg | ORAL_TABLET | Freq: Every day | ORAL | 2 refills | Status: DC

## 2020-12-04 MED ORDER — METHIMAZOLE 5 MG PO TABS: ORAL_TABLET | ORAL | 2 refills | Status: DC

## 2020-12-04 NOTE — Telephone Encounter (Signed)
Amy from [facility] called asking to speak to [facility] regarding pt. Please ask for Amy.   Ph# [phone]

## 2020-12-04 NOTE — Telephone Encounter (Signed)
Pharmacy called regarding pt medication instructions for Methimazole.   Ph# [phone]

## 2020-12-04 NOTE — Telephone Encounter (Signed)
Gave the okay to change #135 to replace the order for #90

## 2020-12-04 NOTE — Telephone Encounter (Signed)
Re-sent Rx with the correct direction. Notified the pharmacy as well

## 2020-12-06 ENCOUNTER — Telehealth (INDEPENDENT_AMBULATORY_CARE_PROVIDER_SITE_OTHER): Payer: Medicare Other | Admitting: Legal Medicine

## 2020-12-06 ENCOUNTER — Encounter: Payer: Self-pay | Admitting: Legal Medicine

## 2020-12-06 VITALS — BP 123/84 | HR 77 | Temp 97.5°F | Ht 67.0 in | Wt 205.0 lb

## 2020-12-06 DIAGNOSIS — J45901 Unspecified asthma with (acute) exacerbation: Secondary | ICD-10-CM

## 2020-12-06 MED ORDER — AZITHROMYCIN 250 MG PO TABS: ORAL_TABLET | ORAL | 0 refills | Status: DC

## 2020-12-06 MED ORDER — PREDNISONE 10 MG (21) PO TBPK: ORAL_TABLET | ORAL | 0 refills | Status: DC

## 2020-12-06 NOTE — Progress Notes (Signed)
Virtual Visit via Telephone Note   This visit type was conducted due to national recommendations for restrictions regarding the COVID-19 Pandemic (e.g. social distancing) in an effort to limit this patient's exposure and mitigate transmission in our community.  Due to his co-morbid illnesses, this patient is at least at moderate risk for complications without adequate follow up.  This format is felt to be most appropriate for this patient at this time.  The patient did not have access to video technology/had technical difficulties with video requiring transitioning to audio format only (telephone).  All issues noted in this document were discussed and addressed.  No physical exam could be performed with this format.  Patient verbally consented to a telehealth visit.   Date:  12/06/2020   ID:  [name] Huff, DOB 02-10-1946, MRN [id number]  Patient Location: Home Provider Location: Office/Clinic  PCP:  Lillard Anes, MD   Evaluation Performed:  New Patient Evaluation  Chief Complaint:  Wheezing, Tuesday wheezing at dot   History of Present Illness:    [name] Huff is a 75 y.o. male with Wheezing, Tuesday wheezing at dot physical. He had immunization for covid. He has a long history of asthma.   The patient does not have symptoms concerning for COVID-19 infection (fever, chills, cough, or new shortness of breath).    Past Medical History:  Diagnosis Date  . Asthma   . GERD (gastroesophageal reflux disease)   . Hyperlipidemia   . Hypertension   . 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 CATARACT EXTRACTION    . TONSILECTOMY/ADENOIDECTOMY WITH MYRINGOTOMY  2020    Family History  Problem Relation Age of Onset  . Thyroid disease Sister   . Goiter Paternal Grandmother   . Heart attack Mother   . Colon cancer Father        Patient passed away unknown    Social History    Socioeconomic History  . Marital status: Legally Separated    Spouse name: Not on file  . Number of children: 0  . Years of education: Not on file  . Highest education level: Not on file  Occupational History  . 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[employer]  Tobacco Use  . Smoking status: Never Smoker  . Smokeless tobacco: Never Used  Substance and Sexual Activity  . Alcohol use: Never  . Drug use: Never  . Sexual activity: Yes    Partners: Female  Other Topics Concern  . Not on file  Social History Narrative  . Not on file   Social Determinants of Health   Financial Resource Strain: Not on file  Food Insecurity: Not on file  Transportation Needs: Not on file  Physical Activity: Not on file  Stress: Not on file  Social Connections: Not on file  Intimate Partner Violence: Not on file    Outpatient Medications Prior to Visit  Medication Sig Dispense Refill  . Albuterol Sulfate, sensor, 108 (90 Base) MCG/ACT AEPB Inhale 2 puffs into the lungs in the morning, at noon, in the evening, and at bedtime. Patient uses proair 1 each 6  . fexofenadine (ALLEGRA) 180 MG tablet Take 180 mg by mouth daily.    . fluticasone (FLONASE) 50 MCG/ACT nasal spray Place 2 sprays into both nostrils daily.    Marland Kitchen lisinopril (ZESTRIL) 20 MG tablet TAKE ONE TABLET BY MOUTH EVERY DAY 90 tablet  2  . methimazole (TAPAZOLE) 5 MG tablet Take 1.5 tablets (7.5 mg total) by mouth daily. 135 tablet 1  . omeprazole (PRILOSEC) 40 MG capsule TAKE 1 CAPSULE BY MOUTH EVERY DAY 90 capsule 2   No facility-administered medications prior to visit.    Allergies:   Patient has no known allergies.   Social History   Tobacco Use  . Smoking status: Never Smoker  . Smokeless tobacco: Never Used  Substance Use Topics  . Alcohol use: Never  . Drug use: Never     Review of Systems  Constitutional: Negative for chills and fever.  HENT: Positive for congestion. Negative for sinus pain and  sore throat.   Eyes: Negative for redness.  Respiratory: Positive for cough and wheezing.   Cardiovascular: Negative for chest pain.  Gastrointestinal: Negative for heartburn.  Genitourinary: Negative for dysuria and urgency.  Musculoskeletal: Negative for myalgias.  Neurological: Negative for dizziness and headaches.  Psychiatric/Behavioral: Negative for depression.     Labs/Other Tests and Data Reviewed:    Recent Labs: 06/11/2020: ALT 15; BUN 12; Creatinine, Ser 1.01; Hemoglobin 16.3; Platelets 249; Potassium 4.6; Sodium 137 12/03/2020: TSH 8.29   Recent Lipid Panel No results found for: CHOL, TRIG, HDL, CHOLHDL, LDLCALC, LDLDIRECT  Wt Readings from Last 3 Encounters:  12/06/20 205 lb (93 kg)  12/03/20 205 lb 2 oz (93 kg)  11/06/20 202 lb 9.6 oz (91.9 kg)     Objective:    Vital Signs:  BP 123/84   Pulse 77   Temp (!) 97.5 F (36.4 C)   Ht 5\' 7"  (1.702 m)   Wt 205 lb (93 kg)   SpO2 98%   BMI 32.11 kg/m    Physical Exam reviewed  ASSESSMENT & PLAN:   Diagnoses and all orders for this visit: Moderate asthma with acute exacerbation, unspecified whether persistent -     azithromycin (ZITHROMAX) 250 MG tablet; 2 tablets on day 1, then 1 tablet daily on days 2-6. -     predniSONE (STERAPRED UNI-PAK 21 TAB) 10 MG (21) TBPK tablet; Take 6ills first day , then 5 pills day 2 and then cut down one pill day until gone        COVID-19 Education: The signs and symptoms of COVID-19 were discussed with the patient and how to seek care for testing (follow up with PCP or arrange E-visit). The importance of social distancing was discussed today.   I spent 15 minutes dedicated to the care of this patient on the date of this encounter to include face-to-face time with the patient, as well as:   Follow Up:  In Person prn  Signed,  Reinaldo Meeker, MD  12/06/2020 1:32 PM    [place]

## 2020-12-10 ENCOUNTER — Telehealth: Payer: Self-pay | Admitting: Internal Medicine

## 2020-12-10 NOTE — Telephone Encounter (Signed)
Message left for patient to return my call.  

## 2020-12-10 NOTE — Telephone Encounter (Signed)
I am not sure why he gets confused about his dose. The AVS was given to him on the day of his visit , but then I sent him to the lab. I called him AFTER the lab results came back on 1/25th and told him to cut it down to 1 tablet daily

## 2020-12-10 NOTE — Telephone Encounter (Signed)
Please advise. Patient stated that he should be taking 5 mg daily. However, on the AVS, it stated 1 tablet and half daily.

## 2020-12-10 NOTE — Telephone Encounter (Signed)
Pt called because his prescription does not coincide with what dr told him to take and he wanted to speak to someone to make sure he is taking his medication correctly. Pt says he heard dr.sham right at 5mg  per day but bottle does not say that. Please give pt a call at  [phone]

## 2020-12-11 MED ORDER — METHIMAZOLE 5 MG PO TABS: 5.0000 mg | ORAL_TABLET | Freq: Every day | ORAL | 1 refills | Status: DC

## 2020-12-11 NOTE — Telephone Encounter (Signed)
Spoken to patient and notified Dr Shamleffer's comments. Verbalized understanding.   

## 2020-12-11 NOTE — Telephone Encounter (Signed)
Patient returning your call, states he is free right now to talk. Ph# [phone] (cell) and if he does not answer he asked to call his house phone and leave a detailed message for him. Home # [phone]

## 2020-12-31 ENCOUNTER — Ambulatory Visit: Payer: Medicare Other | Admitting: Internal Medicine

## 2021-01-16 ENCOUNTER — Other Ambulatory Visit: Payer: Self-pay | Admitting: Legal Medicine

## 2021-01-25 ENCOUNTER — Ambulatory Visit (INDEPENDENT_AMBULATORY_CARE_PROVIDER_SITE_OTHER): Payer: Medicare Other | Admitting: Nurse Practitioner

## 2021-01-25 ENCOUNTER — Other Ambulatory Visit: Payer: Self-pay | Admitting: Nurse Practitioner

## 2021-01-25 ENCOUNTER — Encounter: Payer: Self-pay | Admitting: Nurse Practitioner

## 2021-01-25 VITALS — BP 128/68 | HR 84 | Temp 97.8°F | Ht 67.0 in | Wt 205.0 lb

## 2021-01-25 DIAGNOSIS — J019 Acute sinusitis, unspecified: Secondary | ICD-10-CM | POA: Diagnosis not present

## 2021-01-25 DIAGNOSIS — J0100 Acute maxillary sinusitis, unspecified: Secondary | ICD-10-CM

## 2021-01-25 DIAGNOSIS — J3089 Other allergic rhinitis: Secondary | ICD-10-CM

## 2021-01-25 DIAGNOSIS — J45901 Unspecified asthma with (acute) exacerbation: Secondary | ICD-10-CM | POA: Diagnosis not present

## 2021-01-25 LAB — POC COVID19 BINAXNOW: SARS Coronavirus 2 Ag: NEGATIVE

## 2021-01-25 MED ORDER — AZITHROMYCIN 250 MG PO TABS: ORAL_TABLET | ORAL | 0 refills | Status: DC

## 2021-01-25 MED ORDER — BUDESONIDE-FORMOTEROL FUMARATE 80-4.5 MCG/ACT IN AERO: 2.0000 | INHALATION_SPRAY | Freq: Two times a day (BID) | RESPIRATORY_TRACT | 3 refills | Status: DC

## 2021-01-25 MED ORDER — ALBUTEROL SULFATE (SENSOR) 108 (90 BASE) MCG/ACT IN AEPB
2.0000 | INHALATION_SPRAY | Freq: Four times a day (QID) | RESPIRATORY_TRACT | 6 refills | Status: DC
Start: 2021-01-25 — End: 2023-03-12

## 2021-01-25 MED ORDER — CETIRIZINE HCL 10 MG PO TABS: 10.0000 mg | ORAL_TABLET | Freq: Every day | ORAL | 1 refills | Status: DC

## 2021-01-25 NOTE — Patient Instructions (Addendum)
Begin Symbicort inhaler every day, rinse mouth after using  Use Albuterol inhaler as needed Try Zyrtec samples at night for allergies Hold Allegra   Asthma Attack  Asthma attack, also called acute bronchospasm, is the sudden narrowing and tightening of the air passages, which limits the amount of oxygen that can get into the lungs. The narrowing is caused by inflammation and tightening of the muscles in the air tubes (bronchi) in the lungs. Too much mucus is also produced, which narrows the airways more. This can cause trouble breathing, loud breathing (wheezing), and coughing. The goal of treatment is to open the airways in the lungs and reduce inflammation. What are the causes? Possible causes or triggers of this condition include:  Animal dander, dust mites, or cockroaches.  Mold, pollen from trees or grass, or cold air.  Air pollutants such as dust, household cleaners, aerosol sprays, strong chemicals, strong odors, and smoke of any kind.  Stress or strong emotions such as crying or laughing hard.  Exercise or activity that requires a lot of energy.  Substances in foods and drinks, such as dried fruits and wine, called sulfites.  Certain medicines or medical conditions such as: ? Aspirin or beta-blockers. ? Infections or inflammatory conditions, such as a flu (influenza), a cold, pneumonia, or inflammation of the nasal membranes (rhinitis). ? Gastroesophageal reflux disease (GERD). GERD is a condition in which stomach acid backs up into your esophagus and spills into your trachea (windpipe), which can irritate your airways. What are the signs or symptoms? Symptoms of this condition include:  Wheezing. This may sound like whistling while breathing. This may only happen at night.  Excessive coughing. This may only happen at night.  Chest tightness or pain.  Shortness of breath.  Feeling like you cannot get enough air no matter how hard you breathe (air hunger). How is this  diagnosed? This condition may be diagnosed based on:  Your medical history.  Your symptoms.  A physical exam.  Tests to check for other causes of your symptoms or other conditions that may have triggered your asthma attack. These tests may include: ? A chest X-ray. ? Blood tests. ? Tests to assess lung function, such as breathing into a device that measures how much air you can inhale and exhale (spirometry). How is this treated? Treatment for this condition depends on the severity and cause of your asthma attack.  For mild attacks, you may receive medicines through a hand-held inhaler (metered dose inhaler, or MDI) or through a device that turns liquid medicine into a mist (nebulizer). These medicines include: ? Quick relief or rescue medicines that quickly relax the airways and lungs. ? Long-acting medicines that are used daily to prevent (control) your asthma symptoms.  For moderate or severe attacks, you may be treated with steroid medicines by mouth or through an IV injection at the hospital.  For severe attacks, you may need oxygen therapy or a breathing machine (ventilator).  If your asthma attack was caused by an infection from bacteria, you will be given antibiotic medicines. Follow these instructions at home: Medicines  Take over-the-counter and prescription medicines only as told by your health care provider. ? Keep your medicines up-to-date. ? Make sure you have all of your medicines available at all times.  If you were prescribed an antibiotic medicine, take it as told by your health care provider. Do not stop taking the antibiotic even if you start to feel better.  Tell your doctor if you may be  pregnant to make sure your asthma medicine is safe to use during pregnancy. Avoiding triggers  Keep track of things that trigger your asthma attacks. Avoid exposure to these triggers.  Do not use any products that contain nicotine or tobacco, such as cigarettes,  e-cigarettes, and chewing tobacco. If you need help quitting, ask your health care provider.  When there is a lot of pollen, air pollution, or humidity, keep windows closed and use an air conditioner or go to places with air conditioning.   Asthma action plan  Work with your health care provider to make a written plan for managing and treating your asthma attacks (asthma action plan). This plan should include: ? A list of your asthma triggers and how to avoid them. ? A list of symptoms that you may have during an asthma attack. ? Information about which medicine to take, when to take the medicine and how much of the medicine to take. ? Information to help you understand your peak flow measurements. ? Daily actions that you can take to control your asthma symptoms. ? Contact information for your health care providers.  If you have an asthma attack, act quickly. Follow the emergency steps on your written asthma action plan. This may prevent you from needing to go to the hospital. General instructions  Avoid excessive exercise or activity until your asthma attack goes away. Ask your health care provider what activities are safe for you and when you can return to your normal activities.  Stay up to date on all your vaccines, such as flu and pneumonia vaccines.  Drink enough fluid to keep your urine pale yellow. Staying hydrated helps keep mucus in your lungs thin so it can be coughed up easily.  Do not use alcohol until you have recovered.  Keep all follow-up visits as told by your health care provider. This is important. Asthma requires careful medical care. Contact a health care provider if:  You have followed your action plan for 1 hour and your peak flow reading is still at 50-79%. This is in the yellow zone, which means "caution."  You need to use your quick reliever medicine more frequently than normal.  Your medicines are causing side effects, such as rash, itching, swelling, or  trouble breathing.  Your symptoms do not improve after 48 hours.  You cough up mucus that is thicker than usual.  You have a fever. Get help right away if:  Your peak flow reading is less than 50% of your personal best. This is in the red zone, which means "danger."  You have trouble breathing.  You develop chest pain or discomfort.  Your medicines no longer seem to be helping.  You are coughing up bloody mucus.  You have a fever and your symptoms suddenly get worse.  You have trouble swallowing.  You feel very tired, and breathing becomes tiring. These symptoms may represent a serious problem that is an emergency. Do not wait to see if the symptoms will go away. Get medical help right away. Call your local emergency services (911 in the [place] Do not drive yourself to the hospital. Summary  Asthma attacks are caused by narrowing or tightness in air passages, which causes shortness of breath, coughing, and loud breathing (wheezing).  Many things can trigger an asthma attack, such as allergens, weather changes, exercise, strong odors, and smoke of any kind.  If you have an asthma attack, act quickly. Follow the emergency steps on your written asthma action plan.  Get help right away if you have severe trouble breathing, chest pain, or fever, or if your home medicines are no longer helping with your symptoms. This information is not intended to replace advice given to you by your health care provider. Make sure you discuss any questions you have with your health care provider. Document Revised: 10/25/2019 Document Reviewed: 10/25/2019 Elsevier Patient Education  [place]. Budesonide; Formoterol Inhalation aerosol What is this medicine? BUDESONIDE; FORMOTEROL (byoo DES oh nide; for [place] te rol) inhalation is a combination of 2 drugs to treat asthma and COPD. Formoterol is a bronchodilator that helps keep airways open. Budesonide decreases inflammation in the lungs. Do not  use this drug combination for acute asthma attacks or bronchospasm. This medicine may be used for other purposes; ask your health care provider or pharmacist if you have questions. COMMON BRAND NAME(S): Symbicort What should I tell my health care provider before I take this medicine? They need to know if you have any of these conditions:  diabetes  eye disease, such as glaucoma, cataracts, or blurred vision  heart disease  high blood pressure  immune system problems  infection  irregular heartbeat or rhythm  liver disease  osteoporosis, weak bones  pheochromocytoma  seizures  thyroid disease  an unusual or allergic reaction to budesonide, formoterol, other medicines, foods, dyes, or preservatives  pregnant or trying to get pregnant  breast-feeding How should I use this medicine? This medicine is inhaled through the mouth. Shake well before using. Rinse your mouth with water after use. Make sure not to swallow the water. Take it as directed on the prescription label. Do not use it more often than directed. Keep taking it unless your health care provider tells you to stop. This drug comes with INSTRUCTIONS FOR USE. Ask your pharmacist for directions on how to use this drug. Read the information carefully. Talk to your pharmacist or health care provider if you have questions. Talk to your health care provider about the use of this medicine in children. While it may be prescribed to children as young as 6 years for selected conditions, precautions do apply. Overdosage: If you think you have taken too much of this medicine contact a poison control center or emergency room at once. NOTE: This medicine is only for you. Do not share this medicine with others. What if I miss a dose? If you miss a dose, use it as soon as you can. If it is almost time for your next dose, use only that dose. Do not use double or extra doses. What may interact with this medicine? Do not take the  medicine with any of the following medications:  cisapride  dronedarone  other medicines that contain long-acting beta-2 agonists (LABAs) like arfomoterol, formoterol, indacaterol, olodaterol, salmeterol, vilanterol  pimozide  thioridazine This medicine may also interact with the following medications:  certain antibiotics like clarithromycin, telithromycin  certain antivirals for HIV or hepatitis  certain heart medicines like atenolol, metoprolol  certain medicines for blood pressure, heart disease, irregular heartbeat  certain medicines for depression, anxiety, or psychotic disturbances  certain medicines for fungal infections like ketoconazole, itraconazole  diuretics  grapefruit juice  MAOIs like Marplan, Nardil, and Parnate  mifepristone  other medicines that prolong the QT interval (an abnormal heart rhythm)  some vaccines  steroid medicines like prednisone or cortisone  stimulant medicines for attention disorders, weight loss, or to stay awake  theophylline This list may not describe all possible interactions. Give your health  care provider a list of all the medicines, herbs, non-prescription drugs, or dietary supplements you use. Also tell them if you smoke, drink alcohol, or use illegal drugs. Some items may interact with your medicine. What should I watch for while using this medicine? Visit your health care provider for regular checks on your progress. Tell your health care provider if your symptoms do not start to get better or if they get worse. Follow the plan from your health care provider for treating an acute asthma attack or bronchospasm (wheezing). If your symptoms get worse or do not get better, call your health care provider right away. If you have asthma, you and your health care provider should develop an Asthma Action Plan that is just for you. Be sure to know what to do if you are in the yellow (asthma is getting worse) or red (medical alert)  zones. Do not treat yourself for coughs, colds or allergies without asking your health care provider for advice. Some nonprescription medicines can affect this one. This medicine may increase your risk of getting an infection. Call your health care provider for advice if you get a fever, chills, sore throat, or other symptoms of a cold or flu. Do not treat yourself. Try to avoid being around people who are sick. If you have not had the measles or chickenpox vaccines, tell your health care provider right away if you are around someone with these viruses. Using this medicine for a long time may weaken your bones. The risk of bone fractures may be increased. Talk to your health care provider about your bone health. This medicine may slow your child's growth if it is taken for a long time at high doses. Your health care provider will monitor your child's growth. What side effects may I notice from receiving this medicine? Side effects that you should report to your doctor or health care professional as soon as possible:  allergic reactions like skin rash, itching or hives, swelling of the face, lips, or tongue  changes in vision  eye pain  heartbeat rhythm changes (trouble breathing; chest pain; dizziness; fast, irregular heartbeat; feeling faint or lightheaded, falls)  high blood sugar (increased hunger, thirst or urination; unusually weak or tired, blurry vision)  infection (fever, chills, cough, sore throat, pain or trouble passing urine)  muscle cramps, pain  thrush (white patches in the mouth or mouth sores)  trouble breathing  trouble sleeping  unusually weak or tired Side effects that usually do not require medical attention (report these to your doctor or health care professional if they continue or are bothersome):  back pain  changes in taste  headache  runny or stuffy nose  tremors  upset stomach This list may not describe all possible side effects. Call your doctor  for medical advice about side effects. You may report side effects to FDA at 1-800-FDA-1088. Where should I keep my medicine? Keep out of the reach of children and pets. Store at room temperature between 20 and 25 degrees C (68 and 77 degrees F). Keep inhaler away from extreme heat, cold or humidity. This medicine is flammable. Avoid exposure to heat, fire, flame, and smoking. Throw away 3 months after removing it from the foil pouch, when the dose counter reads "0" or after the expiration date, whichever is first. To get rid of medicines that are no longer needed or have expired:  Take the medicine to a medicine take-back program. Check with your pharmacy or law enforcement to find  a location.  If you cannot return the medicine, ask your pharmacist or health care provider how to get rid of this medicine safely. NOTE: This sheet is a summary. It may not cover all possible information. If you have questions about this medicine, talk to your doctor, pharmacist, or health care provider.  2021 Elsevier/Gold Standard (2020-04-04 17:21:33)

## 2021-01-25 NOTE — Progress Notes (Signed)
Acute Office Visit  Subjective:    Patient ID: [name] Huff, male    DOB: [birth year], 75 y.o.   MRN: [id number]  Chief Complaint  Patient presents with  . Congestion/cough    HPI [name] Huff is a 75 year old Caucasian male that presents with asthma nasal congestion and productive cough. He tells me sinus mucus and chest sputum are thick yellowish-green. Onset of symptoms was 1-week ago. He has a past medical history of allergic rhinitis and asthma. Treatments include Allegra, Albuterol inhaler, and netty pot sinus rinse. He denies fever, fatigue, or body aches. He was positive for COVID-19 in 12/21. He has obtained seasonal flu vaccine.   Past Medical History:  Diagnosis Date  . Asthma   . GERD (gastroesophageal reflux disease)   . Hyperlipidemia   . Hypertension   . 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 CATARACT EXTRACTION    . TONSILECTOMY/ADENOIDECTOMY WITH MYRINGOTOMY  2020    Family History  Problem Relation Age of Onset  . Thyroid disease Sister   . Goiter Paternal Grandmother   . Heart attack Mother   . Colon cancer Father        Patient passed away unknown    Social History   Socioeconomic History  . Marital status: Legally Separated    Spouse name: Not on file  . Number of children: 0  . Years of education: Not on file  . Highest education level: Not on file  Occupational History  . 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[employer]  Tobacco Use  . Smoking status: Never Smoker  . Smokeless tobacco: Never Used  Substance and Sexual Activity  . Alcohol use: Never  . Drug use: Never  . Sexual activity: Yes    Partners: Female  Other Topics Concern  . Not on file  Social History Narrative  . Not on file   Social Determinants of Health   Financial Resource Strain: Not on file  Food Insecurity: Not on file  Transportation Needs: Not on file   Physical Activity: Not on file  Stress: Not on file  Social Connections: Not on file  Intimate Partner Violence: Not on file    Outpatient Medications Prior to Visit  Medication Sig Dispense Refill  . fluticasone (FLONASE) 50 MCG/ACT nasal spray Place 2 sprays into both nostrils daily.    Marland Kitchen lisinopril (ZESTRIL) 20 MG tablet TAKE ONE TABLET BY MOUTH EVERY DAY 90 tablet 2  . methimazole (TAPAZOLE) 5 MG tablet Take 1 tablet (5 mg total) by mouth daily. 90 tablet 1  . omeprazole (PRILOSEC) 40 MG capsule TAKE 1 CAPSULE BY MOUTH EVERY DAY 90 capsule 2  . Albuterol Sulfate, sensor, 108 (90 Base) MCG/ACT AEPB Inhale 2 puffs into the lungs in the morning, at noon, in the evening, and at bedtime. Patient uses proair 1 each 6  . fexofenadine (ALLEGRA) 180 MG tablet Take 180 mg by mouth daily.    Marland Kitchen azithromycin (ZITHROMAX) 250 MG tablet 2 tablets on day 1, then 1 tablet daily on days 2-6. 6 tablet 0  . predniSONE (STERAPRED UNI-PAK 21 TAB) 10 MG (21) TBPK tablet Take 6ills first day , then 5 pills day 2 and then cut down one pill day until gone 21 tablet 0   No facility-administered medications prior to visit.    No Known Allergies  Review of Systems  Constitutional: Negative for chills,  fatigue and fever.  HENT: Positive for congestion, postnasal drip, rhinorrhea, sinus pressure and sinus pain. Negative for ear discharge, ear pain and sore throat.   Respiratory: Positive for cough, chest tightness and wheezing. Negative for shortness of breath.   Cardiovascular: Negative for chest pain, palpitations and leg swelling.  Gastrointestinal: Negative for constipation, diarrhea, nausea and vomiting.       Objective:    Physical Exam Vitals reviewed.  Constitutional:      Appearance: Normal appearance.  HENT:     Head: Normocephalic.     Right Ear: Tympanic membrane normal.     Left Ear: Tympanic membrane normal.     Nose: Congestion and rhinorrhea present.     Mouth/Throat:     Mouth:  Mucous membranes are moist.  Cardiovascular:     Rate and Rhythm: Normal rate and regular rhythm.     Pulses: Normal pulses.     Heart sounds: Normal heart sounds.  Pulmonary:     Effort: Pulmonary effort is normal. No respiratory distress.     Breath sounds: Wheezing present.     Comments: Inspiratory and expiratory wheezing in all lobes Chest:     Chest wall: No tenderness.  Abdominal:     General: Bowel sounds are normal.     Palpations: Abdomen is soft.  Skin:    General: Skin is warm.     Capillary Refill: Capillary refill takes less than 2 seconds.  Neurological:     General: No focal deficit present.     Mental Status: He is alert and oriented to person, place, and time.  Psychiatric:        Mood and Affect: Mood normal.        Behavior: Behavior normal.     BP 128/68 (BP Location: Left Arm, Patient Position: Sitting)   Pulse 84   Temp 97.8 F (36.6 C) (Temporal)   Ht 5\' 7"  (1.702 m)   Wt 205 lb (93 kg)   SpO2 96%   BMI 32.11 kg/m  Wt Readings from Last 3 Encounters:  01/25/21 205 lb (93 kg)  12/06/20 205 lb (93 kg)  12/03/20 205 lb 2 oz (93 kg)    Health Maintenance Due  Topic Date Due  . Hepatitis C Screening  Never done  . COVID-19 Vaccine (1) Never done  . TETANUS/TDAP  Never done  . COLONOSCOPY (Pts 45-49yrs Insurance coverage will need to be confirmed)  Never done  . PNA vac Low Risk Adult (2 of 2 - PCV13) 12/23/2012       Lab Results  Component Value Date   TSH 8.29 (H) 12/03/2020   Lab Results  Component Value Date   WBC 8.6 06/11/2020   HGB 16.3 06/11/2020   HCT 49.4 06/11/2020   MCV 82 06/11/2020   PLT 249 06/11/2020   Lab Results  Component Value Date   NA 137 06/11/2020   K 4.6 06/11/2020   CO2 23 06/11/2020   GLUCOSE 89 06/11/2020   BUN 12 06/11/2020   CREATININE 1.01 06/11/2020   BILITOT 0.6 06/11/2020   ALKPHOS 107 06/11/2020   AST 16 06/11/2020   ALT 15 06/11/2020   PROT 7.1 06/11/2020   ALBUMIN 4.1 06/11/2020    CALCIUM 9.6 06/11/2020   GFR 73.79 11/10/2019        Assessment & Plan:   1. Asthma exacerbation, mild - budesonide-formoterol (SYMBICORT) 80-4.5 MCG/ACT inhaler; Inhale 2 puffs into the lungs 2 (two) times daily.  Dispense: 1 each; Refill:  3 - azithromycin (ZITHROMAX) 250 MG tablet; Take two tablets by mouth on day one, take one tablet by mouth on days two-five  Dispense: 6 tablet; Refill: 0  2. Non-seasonal allergic rhinitis, unspecified trigger - cetirizine (ZYRTEC) 10 MG tablet; Take 1 tablet (10 mg total) by mouth daily.  Dispense: 90 tablet; Refill: 1  3. Acute non-recurrent sinusitis, unspecified location - azithromycin (ZITHROMAX) 250 MG tablet; Take two tablets by mouth on day one, take one tablet by mouth on days two-five  Dispense: 6 tablet; Refill: 0 -COVID-19-negative   Meds ordered this encounter  Medications  . budesonide-formoterol (SYMBICORT) 80-4.5 MCG/ACT inhaler    Sig: Inhale 2 puffs into the lungs 2 (two) times daily.    Dispense:  1 each    Refill:  3    Order Specific Question:   Supervising Provider    AnswerRochel Brome [id number]  . cetirizine (ZYRTEC) 10 MG tablet    Sig: Take 1 tablet (10 mg total) by mouth daily.    Dispense:  90 tablet    Refill:  1    Order Specific Question:   Supervising Provider    AnswerRochel Brome [id number]  . azithromycin (ZITHROMAX) 250 MG tablet    Sig: Take two tablets by mouth on day one, take one tablet by mouth on days two-five    Dispense:  6 tablet    Refill:  0    Order Specific Question:   Supervising Provider    AnswerShelton Silvas   Begin Symbicort inhaler every day, rinse mouth after using  Use Albuterol inhaler as needed Try Zyrtec samples at night for allergies Hold Allegra    Follow-up: As needed  Rip Harbour, NP

## 2021-02-06 ENCOUNTER — Other Ambulatory Visit: Payer: Self-pay | Admitting: Legal Medicine

## 2021-02-21 ENCOUNTER — Encounter: Payer: Self-pay | Admitting: Nurse Practitioner

## 2021-02-21 ENCOUNTER — Telehealth (INDEPENDENT_AMBULATORY_CARE_PROVIDER_SITE_OTHER): Payer: Medicare Other | Admitting: Nurse Practitioner

## 2021-02-21 VITALS — BP 115/75 | HR 76 | Temp 97.2°F

## 2021-02-21 DIAGNOSIS — J0191 Acute recurrent sinusitis, unspecified: Secondary | ICD-10-CM

## 2021-02-21 DIAGNOSIS — J45909 Unspecified asthma, uncomplicated: Secondary | ICD-10-CM | POA: Diagnosis not present

## 2021-02-21 MED ORDER — AMOXICILLIN-POT CLAVULANATE 875-125 MG PO TABS: 1.0000 | ORAL_TABLET | Freq: Two times a day (BID) | ORAL | 0 refills | Status: DC

## 2021-02-21 NOTE — Progress Notes (Signed)
Virtual Visit via Telephone Note   This visit type was conducted due to national recommendations for restrictions regarding the COVID-19 Pandemic (e.g. social distancing) in an effort to limit this patient's exposure and mitigate transmission in our community.  Due to his co-morbid illnesses, this patient is at least at moderate risk for complications without adequate follow up.  This format is felt to be most appropriate for this patient at this time.  The patient did not have access to video technology/had technical difficulties with video requiring transitioning to audio format only (telephone).  All issues noted in this document were discussed and addressed.  No physical exam could be performed with this format.  Patient verbally consented to a telehealth visit.   Date:  02/21/2021   ID:  [name] Huff, DOB 09-17-1946, MRN [id number]  Patient Location: Home Provider Location: Office/Clinic  PCP:  Lillard Anes, MD   Evaluation Performed:  Established patient, acute telemedicine   Chief Complaint:  Chest congestion, cough  History of Present Illness:    [name] Huff is a 75 y.o. male with chest congestion, productive cough, and sinus congestion. Pt has asthma and chronic allergic rhinitis. He was treated on 01/25/21 for sinusitis and asthma exacerbation with course of Azithromycin. Treatment for allergic rhinitis has included Flonase and Allegra. He states symptoms have improved but continues to experience persistent congested productive cough. He denies increased wheezing or dyspnea.      The patient does have symptoms concerning for COVID-19 infection (fever, chills, cough, or new shortness of breath).    Past Medical History:  Diagnosis Date  . Asthma   . GERD (gastroesophageal reflux disease)   . Hyperlipidemia   . Hypertension   . 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 CATARACT EXTRACTION    . TONSILECTOMY/ADENOIDECTOMY WITH MYRINGOTOMY  2020    Family History  Problem Relation Age of Onset  . Thyroid disease Sister   . Goiter Paternal Grandmother   . Heart attack Mother   . Colon cancer Father        Patient passed away unknown    Social History   Socioeconomic History  . Marital status: Legally Separated    Spouse name: Not on file  . Number of children: 0  . Years of education: Not on file  . Highest education level: Not on file  Occupational History  . 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[employer]  Tobacco Use  . Smoking status: Never Smoker  . Smokeless tobacco: Never Used  Substance and Sexual Activity  . Alcohol use: Never  . Drug use: Never  . Sexual activity: Yes    Partners: Female  Other Topics Concern  . Not on file  Social History Narrative  . Not on file      Outpatient Medications Prior to Visit  Medication Sig Dispense Refill  . Albuterol Sulfate, sensor, 108 (90 Base) MCG/ACT AEPB Inhale 2 puffs into the lungs in the morning, at noon, in the evening, and at bedtime. Patient uses proair 1 each 6  . budesonide-formoterol (SYMBICORT) 80-4.5 MCG/ACT inhaler Inhale 2 puffs into the lungs 2 (two) times daily. 1 each 3  . cetirizine (ZYRTEC) 10 MG tablet Take 1 tablet (10 mg total) by mouth daily. 90 tablet 1  . fluticasone (FLONASE) 50 MCG/ACT nasal spray USE TWO SPRAYS IN EACH NOSTRIL DAILY. 16 g 6  .  lisinopril (ZESTRIL) 20 MG tablet TAKE ONE TABLET BY MOUTH EVERY DAY 90 tablet 2  . methimazole (TAPAZOLE) 5 MG tablet Take 1 tablet (5 mg total) by mouth daily. 90 tablet 1  . omeprazole (PRILOSEC) 40 MG capsule TAKE 1 CAPSULE BY MOUTH EVERY DAY 90 capsule 2  . azithromycin (ZITHROMAX) 250 MG tablet Take two tablets by mouth on day one, take one tablet by mouth on days two-five 6 tablet 0   No facility-administered medications prior to visit.    Allergies:   Patient has no known allergies.    Social History   Tobacco Use  . Smoking status: Never Smoker  . Smokeless tobacco: Never Used  Substance Use Topics  . Alcohol use: Never  . Drug use: Never     Review of Systems  Constitutional: Positive for malaise/fatigue.  HENT: Positive for congestion.   Eyes: Negative.   Respiratory: Positive for cough and sputum production. Negative for wheezing.   Cardiovascular: Negative for chest pain and palpitations.  Gastrointestinal: Negative.   Genitourinary: Negative.   Musculoskeletal: Negative.   Skin: Negative for rash.  Neurological: Negative.   Endo/Heme/Allergies: Negative.   Psychiatric/Behavioral: Negative.      Labs/Other Tests and Data Reviewed:    Recent Labs: 06/11/2020: ALT 15; BUN 12; Creatinine, Ser 1.01; Hemoglobin 16.3; Platelets 249; Potassium 4.6; Sodium 137 12/03/2020: TSH 8.29    Wt Readings from Last 3 Encounters:  01/25/21 205 lb (93 kg)  12/06/20 205 lb (93 kg)  12/03/20 205 lb 2 oz (93 kg)     Objective:    Vital Signs:  BP 115/75   Pulse 76   Temp (!) 97.2 F (36.2 C)   SpO2 98%    Physical Exam No physical exam due to telemedicine visit  ASSESSMENT & PLAN:    1. Acute recurrent sinusitis, unspecified location - amoxicillin-clavulanate (AUGMENTIN) 875-125 MG tablet; Take 1 tablet by mouth 2 (two) times daily.  Dispense: 20 tablet; Refill: 0 -continue Allegra and Flonase nasal spray  2. Asthma due to environmental allergies -continue Symbicort and Albuterol inhalers  3. Mild persistent asthma without complication    [id number] Education: The signs and symptoms of COVID-19 were discussed with the patient and how to seek care for testing (follow up with PCP or arrange E-visit). The importance of social distancing was discussed today.   I spent minutes dedicated to the care of this patient on the date of this encounter to include 10 time with the patient, as well as:EMR review and prescription medication management.   Follow Up:   In Person prn  Signed,  Rip Harbour, NP  02/21/2021 11:29 AM    [place]

## 2021-04-22 ENCOUNTER — Ambulatory Visit (INDEPENDENT_AMBULATORY_CARE_PROVIDER_SITE_OTHER): Payer: Medicare Other | Admitting: Internal Medicine

## 2021-04-22 ENCOUNTER — Other Ambulatory Visit: Payer: Self-pay

## 2021-04-22 ENCOUNTER — Encounter: Payer: Self-pay | Admitting: Internal Medicine

## 2021-04-22 VITALS — BP 126/82 | HR 82 | Ht 67.0 in | Wt 204.0 lb

## 2021-04-22 DIAGNOSIS — E0500 Thyrotoxicosis with diffuse goiter without thyrotoxic crisis or storm: Secondary | ICD-10-CM

## 2021-04-22 DIAGNOSIS — E0590 Thyrotoxicosis, unspecified without thyrotoxic crisis or storm: Secondary | ICD-10-CM | POA: Diagnosis not present

## 2021-04-22 LAB — TSH: TSH: 5.84 u[IU]/mL — ABNORMAL HIGH (ref 0.35–4.50)

## 2021-04-22 LAB — T4, FREE: Free T4: 0.65 ng/dL (ref 0.60–1.60)

## 2021-04-22 MED ORDER — METHIMAZOLE 5 MG PO TABS: 5.0000 mg | ORAL_TABLET | ORAL | 1 refills | Status: DC

## 2021-04-22 NOTE — Progress Notes (Signed)
Name: [name] Huff  MRN/ DOB: [id number], August 27, 1946    Age/ Sex: 75 y.o., male     PCP: Lillard Anes, MD   Reason for Endocrinology Evaluation: Hyperthyroidism     Initial Endocrinology Clinic Visit: 05/16/2019    PATIENT IDENTIFIER: [name] is a 75 y.o., male with a past medical history of HTN. He has followed with [facility] since 05/16/2019 for consultative assistance with management of his Hyperthyroidism      HISTORICAL SUMMARY: The patient was first diagnosed with hypothyroidism many years ago, he was  on LT-4 replacement for many years up to 150 mcg daily,  but in 2019 his dose has been gradually reduced and was off for a few months prior to his presentation to our clinic.    He was diagnosed with hyperthyroidism secondary to graves' disease in 05/2019 and started on Methimazole     Sister with thyroid disease.  SUBJECTIVE:    Today (04/22/2021):  [name] Huff is here for a follow up on hyperthyroidism. 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 He has never used smokeless tobacco. He reports that he does not drink alcohol and does not use drugs. Family History:  Family History  Problem Relation Age of Onset   Thyroid disease Sister    Goiter Paternal Grandmother    Heart attack Mother    Colon cancer  Father        Patient passed away unknown     HOME MEDICATIONS: Allergies as of 04/22/2021   No Known Allergies      Medication List        Accurate as of April 22, 2021  8:13 AM. If you have any questions, ask your nurse or doctor.          STOP taking these medications    amoxicillin-clavulanate 875-125 MG tablet Commonly known as: AUGMENTIN Stopped by: Dorita Sciara, MD       TAKE these medications    Albuterol Sulfate (sensor) 108 (90 Base) MCG/ACT Aepb Inhale 2 puffs into the lungs in the morning, at noon, in the evening, and at bedtime. Patient uses proair   budesonide-formoterol 80-4.5 MCG/ACT inhaler Commonly known as: SYMBICORT Inhale 2 puffs into the lungs 2 (two) times daily.   cetirizine 10 MG tablet Commonly known as: ZYRTEC Take 1 tablet (10 mg total) by mouth daily.   fluticasone 50 MCG/ACT nasal spray Commonly known as: FLONASE USE TWO SPRAYS IN EACH NOSTRIL DAILY.   lisinopril 20 MG tablet Commonly known as: ZESTRIL TAKE ONE TABLET BY MOUTH EVERY DAY   methimazole 5 MG tablet Commonly known as: TAPAZOLE Take 1 tablet (5 mg total) by mouth daily.   omeprazole 40 MG capsule Commonly known as: PRILOSEC TAKE 1 CAPSULE BY MOUTH EVERY  DAY          OBJECTIVE:   PHYSICAL EXAM: VS: BP 126/82   Pulse 82   Ht 5\' 7"  (1.702 m)   Wt 204 lb (92.5 kg)   SpO2 98%   BMI 31.95 kg/m    EXAM: General: Pt appears well and is in NAD  Neck: General: Supple without adenopathy. Thyroid: Thyroid size normal.  No goiter or nodules appreciated. No thyroid bruit.  Lungs: Clear with good BS bilat with no rales, rhonchi, or wheezes  Heart: Auscultation: RRR.  Abdomen: Normoactive bowel sounds, soft, nontender, without masses or organomegaly palpable  Extremities:  BL LE: No pretibial edema normal ROM and strength.  Mental Status: Judgment, insight: Intact Orientation: Oriented to time, place, and person Mood and affect: No depression,  anxiety, or agitation     DATA REVIEWED:  Results for LAMARK, SCHUE (MRN [id number]) as of 04/22/2021 16:52  Ref. Range 04/22/2021 08:30  TSH Latest Ref Range: 0.35 - 4.50 uIU/mL 5.84 (H)  T4,Free(Direct) Latest Ref Range: 0.60 - 1.60 ng/dL 0.65    Results for SHIVANK, PINEDO (MRN [id number]) as of 08/16/2019 13:51  Ref. Range 05/16/2019 15:04  TRAB Latest Ref Range: <=2.00 IU/L >40.00 (H)   ASSESSMENT / PLAN / RECOMMENDATIONS:   Hyperthyroidism Secondary To Graves' Disease:   - Pt with Hashi-Toxi given his prior diagnosis of hypothyroidism  - He is clinically euthyroid  - No side effects to methimazole  - Repeat TFT's today show elevated TSH, will reduce the dose     Medications  Decrease Methimazole 5 mg half a Tablet Fridays, Saturday and Sundays and 1 tablet Monday through Thursday   2. Graves' Disease:    - No extra-thyroidal manifestations of graves' disease - Up to date on eye exams    F/U in 4 months   Addendum: results and instruction discussed with the pt on 04/22/2021 at 1650   Signed electronically by: Mack Guise, MD  [facility]  [facility] [address] [place], [place] [address] Phone: [phone] FAX: [phone]      CC: Lillard Anes, MD [place] [address] Phone: [phone]  Fax: [phone]   Return to Endocrinology clinic as below: No future appointments.

## 2021-05-15 ENCOUNTER — Other Ambulatory Visit: Payer: Self-pay

## 2021-05-15 ENCOUNTER — Ambulatory Visit (INDEPENDENT_AMBULATORY_CARE_PROVIDER_SITE_OTHER): Payer: Medicare Other | Admitting: Nurse Practitioner

## 2021-05-15 ENCOUNTER — Encounter: Payer: Self-pay | Admitting: Nurse Practitioner

## 2021-05-15 VITALS — BP 118/72 | HR 88 | Temp 97.5°F | Ht 67.0 in | Wt 204.0 lb

## 2021-05-15 DIAGNOSIS — L03115 Cellulitis of right lower limb: Secondary | ICD-10-CM

## 2021-05-15 DIAGNOSIS — L03116 Cellulitis of left lower limb: Secondary | ICD-10-CM

## 2021-05-15 MED ORDER — TRIAMCINOLONE ACETONIDE 40 MG/ML IJ SUSP
60.0000 mg | Freq: Once | INTRAMUSCULAR | Status: AC
  Administered 2021-05-15: 14:00:00 60 mg via INTRAMUSCULAR

## 2021-05-15 MED ORDER — FUROSEMIDE 20 MG PO TABS: 20.0000 mg | ORAL_TABLET | Freq: Every day | ORAL | 0 refills | Status: DC | PRN

## 2021-05-15 MED ORDER — SULFAMETHOXAZOLE-TRIMETHOPRIM 800-160 MG PO TABS: 1.0000 | ORAL_TABLET | Freq: Two times a day (BID) | ORAL | 0 refills | Status: DC

## 2021-05-15 NOTE — Progress Notes (Signed)
Acute Office Visit  Subjective:    Patient ID: [name] Huff, male    DOB: [birth year], 75 y.o.   MRN: [id number]  Chief Complaint  Patient presents with   Bilateral leg redness/swelling    Been going on for 4-5 weeks after getting bit by red ants.    HPI Patient is in today for redness and swelling to bilateral lower extremities. Onset was 5-weeks ago after he was bitten by multiple fire ants. Treatment has included Benadryl po, cold compresses, and compression stockings with minimal results. He denies prior allergy to fire ant venom or peripheral arterial disease.   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     Past Surgical History:  Procedure Laterality Date   CATARACT EXTRACTION     TONSILECTOMY/ADENOIDECTOMY WITH MYRINGOTOMY  2020    Family History  Problem Relation Age of Onset   Thyroid disease Sister    Goiter Paternal Grandmother    Heart attack Mother    Colon cancer Father        Patient passed away unknown    Social History   Socioeconomic History   Marital status: Legally Separated    Spouse name: Not on file   Number of children: 0   Years of education: Not on file   Highest education level: Not on file  Occupational History   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[employer]  Tobacco Use   Smoking status: Never   Smokeless tobacco: Never  Substance and Sexual Activity   Alcohol use: Never   Drug use: Never   Sexual activity: Yes    Partners: Female  Other Topics Concern   Not on file  Social History Narrative   Not on file   Social Determinants of Health   Financial Resource Strain: Not on file  Food Insecurity: Not on file  Transportation Needs: Not on file  Physical Activity: Not on file  Stress: Not on file  Social Connections: Not on file  Intimate  Partner Violence: Not on file    Outpatient Medications Prior to Visit  Medication Sig Dispense Refill   Albuterol Sulfate, sensor, 108 (90 Base) MCG/ACT AEPB Inhale 2 puffs into the lungs in the morning, at noon, in the evening, and at bedtime. Patient uses proair 1 each 6   budesonide-formoterol (SYMBICORT) 80-4.5 MCG/ACT inhaler Inhale 2 puffs into the lungs 2 (two) times daily. 1 each 3   fluticasone (FLONASE) 50 MCG/ACT nasal spray USE TWO SPRAYS IN EACH NOSTRIL DAILY. 16 g 6   lisinopril (ZESTRIL) 20 MG tablet TAKE ONE TABLET BY MOUTH EVERY DAY 90 tablet 2   methimazole (TAPAZOLE) 5 MG tablet Take 1 tablet (5 mg total) by mouth as directed. 1 tablet Monday through Thursday, and HALF a tablet Fridays, Saturday and Sundays 72 tablet 1   omeprazole (PRILOSEC) 40 MG capsule TAKE 1 CAPSULE BY MOUTH EVERY DAY 90 capsule 2   cetirizine (ZYRTEC) 10 MG tablet Take 1 tablet (10 mg total) by mouth daily. 90 tablet 1   No facility-administered medications prior to visit.    No Known Allergies  Review of Systems  Constitutional:  Negative for fever.  Eyes: Negative.   Respiratory:  Negative for cough and shortness of breath.   Cardiovascular:  Positive for leg swelling (Bilateral swelling and  redness). Negative for chest pain.  Gastrointestinal:  Negative for abdominal pain, nausea and vomiting.  Endocrine: Negative.   Genitourinary: Negative.   Musculoskeletal:  Negative for arthralgias, gait problem, joint swelling and myalgias.  Skin:        Insect bites, redness, and warmth to bilateral lower extremities  Allergic/Immunologic: Negative.   Neurological:  Negative for dizziness and headaches.  Hematological: Negative.   Psychiatric/Behavioral: Negative.        Objective:    Physical Exam Vitals reviewed.  Constitutional:      Appearance: Normal appearance.  Cardiovascular:     Rate and Rhythm: Normal rate and regular rhythm.     Pulses: Normal pulses.     Heart sounds: Normal  heart sounds.  Pulmonary:     Effort: Pulmonary effort is normal.     Breath sounds: Normal breath sounds.  Musculoskeletal:        General: Swelling (bilateral lower extremities) present.  Skin:    General: Skin is warm and dry.     Capillary Refill: Capillary refill takes less than 2 seconds.     Findings: Erythema (warmth, bilateral lower extremities) and lesion present.          Comments: Insect bites to bilateral lower extremities  Neurological:     General: No focal deficit present.     Mental Status: He is alert and oriented to person, place, and time.  Psychiatric:        Mood and Affect: Mood normal.        Behavior: Behavior normal.    BP 118/72 (BP Location: Left Arm, Patient Position: Sitting)   Pulse 88   Temp (!) 97.5 F (36.4 C) (Temporal)   Ht 5\' 7"  (1.702 m)   Wt 204 lb (92.5 kg)   SpO2 98%   BMI 31.95 kg/m  Wt Readings from Last 3 Encounters:  05/15/21 204 lb (92.5 kg)  04/22/21 204 lb (92.5 kg)  01/25/21 205 lb (93 kg)    Health Maintenance Due  Topic Date Due   COVID-19 Vaccine (1) Never done   Hepatitis C Screening  Never done   TETANUS/TDAP  Never done   Zoster Vaccines- Shingrix (1 of 2) Never done   PNA vac Low Risk Adult (2 of 2 - PCV13) 12/23/2012       Lab Results  Component Value Date   TSH 5.84 (H) 04/22/2021   Lab Results  Component Value Date   WBC 8.6 06/11/2020   HGB 16.3 06/11/2020   HCT 49.4 06/11/2020   MCV 82 06/11/2020   PLT 249 06/11/2020   Lab Results  Component Value Date   NA 137 06/11/2020   K 4.6 06/11/2020   CO2 23 06/11/2020   GLUCOSE 89 06/11/2020   BUN 12 06/11/2020   CREATININE 1.01 06/11/2020   BILITOT 0.6 06/11/2020   ALKPHOS 107 06/11/2020   AST 16 06/11/2020   ALT 15 06/11/2020   PROT 7.1 06/11/2020   ALBUMIN 4.1 06/11/2020   CALCIUM 9.6 06/11/2020   GFR 73.79 11/10/2019        Assessment & Plan:   1. Bilateral cellulitis of lower leg - triamcinolone acetonide (KENALOG-40) injection  60 mg - sulfamethoxazole-trimethoprim (BACTRIM DS) 800-160 MG tablet; Take 1 tablet by mouth 2 (two) times daily.  Dispense: 20 tablet; Refill: 0 - furosemide (LASIX) 20 MG tablet; Take 1 tablet (20 mg total) by mouth daily as needed for edema (swelling).  Dispense: 10 tablet; Refill: 0 - CBC with  Differential/Platelet - Comprehensive metabolic panel    Kenalog injection given in office Take Bactrim DS (antibiotic) twice daily for 10 days Take Lasix 20 mg in am as needed for swelling of bilateral legs Follow-up as needed   I,Lauren M Auman,acting as a scribe for [employer], NP.,have documented all relevant documentation on the behalf of Rip Harbour, NP,as directed by  Rip Harbour, NP while in the presence of Rip Harbour, NP.   I, Rip Harbour, NP, have reviewed all documentation for this visit. The documentation on 05/15/21 for the exam, diagnosis, procedures, and orders are all accurate and complete.    Follow-up: as needed  Signed, Jerrell Belfast, DNP

## 2021-05-15 NOTE — Patient Instructions (Addendum)
Kenalog injection given in office Take Bactrim DS (antibiotic) twice daily for 10 days Take Lasix 20 mg in am as needed for swelling of bilateral legs Follow-up as needed   Cellulitis, Adult  Cellulitis is a skin infection. The infected area is often warm, red, swollen, and sore. It occurs most often in the arms and lower legs. It is very importantto get treated for this condition. What are the causes? This condition is caused by bacteria. The bacteria enter through a break in theskin, such as a cut, burn, insect bite, open sore, or crack. What increases the risk? This condition is more likely to occur in people who: Have a weak body defense system (immune system). Have open cuts, burns, bites, or scrapes on the skin. Are older than 76 years of age. Have a blood sugar problem (diabetes). Have a long-lasting (chronic) liver disease (cirrhosis) or kidney disease. Are very overweight (obese). Have a skin problem, such as: Itchy rash (eczema). Slow movement of blood in the veins (venous stasis). Fluid buildup below the skin (edema). Have been treated with high-energy rays (radiation). Use IV drugs. What are the signs or symptoms? Symptoms of this condition include: Skin that is: Red. Streaking. Spotting. Swollen. Sore or painful when you touch it. Warm. A fever. Chills. Blisters. How is this diagnosed? This condition is diagnosed based on: Medical history. Physical exam. Blood tests. Imaging tests. How is this treated? Treatment for this condition may include: Medicines to treat infections or allergies. Home care, such as: Rest. Placing cold or warm cloths (compresses) on the skin. Hospital care, if the condition is very bad. Follow these instructions at home: Medicines Take over-the-counter and prescription medicines only as told by your doctor. If you were prescribed an antibiotic medicine, take it as told by your doctor. Do not stop taking it even if you start to  feel better. General instructions  Drink enough fluid to keep your pee (urine) pale yellow. Do not touch or rub the infected area. Raise (elevate) the infected area above the level of your heart while you are sitting or lying down. Place cold or warm cloths on the area as told by your doctor. Keep all follow-up visits as told by your doctor. This is important.  Contact a doctor if: You have a fever. You do not start to get better after 1-2 days of treatment. Your bone or joint under the infected area starts to hurt after the skin has healed. Your infection comes back. This can happen in the same area or another area. You have a swollen bump in the area. You have new symptoms. You feel ill and have muscle aches and pains. Get help right away if: Your symptoms get worse. You feel very sleepy. You throw up (vomit) or have watery poop (diarrhea) for a long time. You see red streaks coming from the area. Your red area gets larger. Your red area turns dark in color. These symptoms may represent a serious problem that is an emergency. Do not wait to see if the symptoms will go away. Get medical help right away. Call your local emergency services (911 in the [place] Do not drive yourself to the hospital. Summary Cellulitis is a skin infection. The area is often warm, red, swollen, and sore. This condition is treated with medicines, rest, and cold and warm cloths. Take all medicines only as told by your doctor. Tell your doctor if symptoms do not start to get better after 1-2 days of treatment. This information  is not intended to replace advice given to you by your health care provider. Make sure you discuss any questions you have with your healthcare provider. Document Revised: 03/18/2018 Document Reviewed: 03/18/2018 Elsevier Patient Education  [employer].

## 2021-05-16 LAB — CBC WITH DIFFERENTIAL/PLATELET
Basophils Absolute: 0.1 10*3/uL (ref 0.0–0.2)
Basos: 1 %
EOS (ABSOLUTE): 0.4 10*3/uL (ref 0.0–0.4)
Eos: 4 %
Hematocrit: 48.2 % (ref 37.5–51.0)
Hemoglobin: 16.3 g/dL (ref 13.0–17.7)
Immature Grans (Abs): 0 10*3/uL (ref 0.0–0.1)
Immature Granulocytes: 0 %
Lymphocytes Absolute: 2.4 10*3/uL (ref 0.7–3.1)
Lymphs: 27 %
MCH: 26.5 pg — ABNORMAL LOW (ref 26.6–33.0)
MCHC: 33.8 g/dL (ref 31.5–35.7)
MCV: 78 fL — ABNORMAL LOW (ref 79–97)
Monocytes Absolute: 0.6 10*3/uL (ref 0.1–0.9)
Monocytes: 7 %
Neutrophils Absolute: 5.4 10*3/uL (ref 1.4–7.0)
Neutrophils: 61 %
Platelets: 281 10*3/uL (ref 150–450)
RBC: 6.15 x10E6/uL — ABNORMAL HIGH (ref 4.14–5.80)
RDW: 13.9 % (ref 11.6–15.4)
WBC: 8.8 10*3/uL (ref 3.4–10.8)

## 2021-05-16 LAB — COMPREHENSIVE METABOLIC PANEL
ALT: 18 IU/L (ref 0–44)
AST: 17 IU/L (ref 0–40)
Albumin/Globulin Ratio: 1.5 (ref 1.2–2.2)
Albumin: 4.4 g/dL (ref 3.7–4.7)
Alkaline Phosphatase: 105 IU/L (ref 44–121)
BUN/Creatinine Ratio: 11 (ref 10–24)
BUN: 11 mg/dL (ref 8–27)
Bilirubin Total: 0.4 mg/dL (ref 0.0–1.2)
CO2: 22 mmol/L (ref 20–29)
Calcium: 9.5 mg/dL (ref 8.6–10.2)
Chloride: 97 mmol/L (ref 96–106)
Creatinine, Ser: 0.99 mg/dL (ref 0.76–1.27)
Globulin, Total: 3 g/dL (ref 1.5–4.5)
Glucose: 94 mg/dL (ref 65–99)
Potassium: 4.2 mmol/L (ref 3.5–5.2)
Sodium: 133 mmol/L — ABNORMAL LOW (ref 134–144)
Total Protein: 7.4 g/dL (ref 6.0–8.5)
eGFR: 79 mL/min/{1.73_m2} (ref >59)

## 2021-08-23 ENCOUNTER — Other Ambulatory Visit: Payer: Self-pay

## 2021-08-23 ENCOUNTER — Encounter: Payer: Self-pay | Admitting: Internal Medicine

## 2021-08-23 ENCOUNTER — Ambulatory Visit (INDEPENDENT_AMBULATORY_CARE_PROVIDER_SITE_OTHER): Payer: Medicare Other | Admitting: Internal Medicine

## 2021-08-23 ENCOUNTER — Telehealth: Payer: Self-pay | Admitting: Internal Medicine

## 2021-08-23 VITALS — BP 130/76 | HR 72 | Ht 67.0 in | Wt 199.0 lb

## 2021-08-23 DIAGNOSIS — E0590 Thyrotoxicosis, unspecified without thyrotoxic crisis or storm: Secondary | ICD-10-CM

## 2021-08-23 DIAGNOSIS — E0500 Thyrotoxicosis with diffuse goiter without thyrotoxic crisis or storm: Secondary | ICD-10-CM | POA: Diagnosis not present

## 2021-08-23 LAB — TSH: TSH: 2.07 u[IU]/mL (ref 0.35–5.50)

## 2021-08-23 LAB — T4, FREE: Free T4: 0.77 ng/dL (ref 0.60–1.60)

## 2021-08-23 MED ORDER — METHIMAZOLE 5 MG PO TABS: 5.0000 mg | ORAL_TABLET | ORAL | 1 refills | Status: DC

## 2021-08-23 NOTE — Telephone Encounter (Signed)
Patient notified

## 2021-08-23 NOTE — Telephone Encounter (Signed)
Please let the patient know that his thyroid hormones are normal and to continue current dose of methimazole at half a tablet Friday Saturday Sunday and 1 tablet the rest of the week

## 2021-08-23 NOTE — Telephone Encounter (Signed)
Vm left for patient to call back.

## 2021-08-23 NOTE — Progress Notes (Signed)
Name: [name] Huff  MRN/ DOB: [id number], 01/24/1946    Age/ Sex: 75 y.o., male     PCP: Lillard Anes, MD   Reason for Endocrinology Evaluation: Hyperthyroidism     Initial Endocrinology Clinic Visit: 05/16/2019    PATIENT IDENTIFIER: [name] Huff is a 75 y.o., male with a past medical history of HTN. He has followed with [facility] since 05/16/2019 for consultative assistance with management of his Hyperthyroidism      HISTORICAL SUMMARY: The patient was first diagnosed with hypothyroidism many years ago, he was  on LT-4 replacement for many years up to 150 mcg daily,  but in 2019 his dose has been gradually reduced and was off for a few months prior to his presentation to our clinic.    He was diagnosed with hyperthyroidism secondary to graves' disease in 05/2019 and started on Methimazole     Sister with thyroid disease.  SUBJECTIVE:    Today (08/23/2021):  [name] Huff is here for a follow up on hyperthyroidism. 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 He has never used smokeless tobacco. He reports that he does not drink alcohol and does not use drugs. Family History:  Family History  Problem Relation Age of Onset   Thyroid disease Sister    Goiter Paternal  Grandmother    Heart attack Mother    Colon cancer Father        Patient passed away unknown     HOME MEDICATIONS: Allergies as of 08/23/2021   No Known Allergies      Medication List        Accurate as of August 23, 2021 12:47 PM. If you have any questions, ask your nurse or doctor.          STOP taking these medications    sulfamethoxazole-trimethoprim 800-160 MG tablet Commonly known as: BACTRIM DS Stopped by: Dorita Sciara, MD       TAKE these medications    Albuterol Sulfate (sensor) 108 (90 Base) MCG/ACT Aepb Inhale 2 puffs into the lungs in the morning, at noon, in the evening, and at bedtime. Patient uses proair   budesonide-formoterol 80-4.5 MCG/ACT inhaler Commonly known as: SYMBICORT Inhale 2 puffs into the lungs 2 (two) times daily.   diphenhydrAMINE 25 MG tablet Commonly known as: BENADRYL Take 25 mg by mouth every 6 (six) hours as needed.   fexofenadine 180 MG tablet Commonly known as: ALLEGRA Take 180 mg by mouth daily.   fluticasone 50 MCG/ACT nasal spray Commonly known as: FLONASE USE TWO SPRAYS IN EACH NOSTRIL DAILY.   furosemide 20 MG tablet Commonly known as: LASIX Take 1 tablet (20 mg total) by mouth daily as needed for edema (swelling).   lisinopril  20 MG tablet Commonly known as: ZESTRIL TAKE ONE TABLET BY MOUTH EVERY DAY   methimazole 5 MG tablet Commonly known as: TAPAZOLE Take 1 tablet (5 mg total) by mouth as directed. 1 tablet Monday through Thursday, and HALF a tablet Fridays, Saturday and Sundays   omeprazole 40 MG capsule Commonly known as: PRILOSEC TAKE 1 CAPSULE BY MOUTH EVERY DAY          OBJECTIVE:   PHYSICAL EXAM: VS: BP 130/76 (BP Location: Left Arm, Patient Position: Sitting, Cuff Size: Small)   Pulse 72   Ht 5\' 7"  (1.702 m)   Wt 199 lb (90.3 kg)   SpO2 99%   BMI 31.17 kg/m    EXAM: General: Pt appears well and is in NAD  Neck: General: Supple without adenopathy. Thyroid: Thyroid  size normal.  No goiter or nodules appreciated. No thyroid bruit.  Lungs: Clear with good BS bilat with no rales, rhonchi, or wheezes  Heart: Auscultation: RRR.  Abdomen: Normoactive bowel sounds, soft, nontender, without masses or organomegaly palpable  Extremities:  BL LE: No pretibial edema normal ROM and strength.  Mental Status: Judgment, insight: Intact Orientation: Oriented to time, place, and person Mood and affect: No depression, anxiety, or agitation     DATA REVIEWED:  Results for [name] Huff, [name] Huff (MRN [id number]) as of 08/23/2021 12:47  Ref. Range 08/23/2021 08:26  TSH Latest Ref Range: 0.35 - 5.50 uIU/mL 2.07  T4,Free(Direct) Latest Ref Range: 0.60 - 1.60 ng/dL 0.77    Results for [name] Huff, [name] Huff (MRN [id number]) as of 08/16/2019 13:51  Ref. Range 05/16/2019 15:04  TRAB Latest Ref Range: <=2.00 IU/L >40.00 (H)   ASSESSMENT / PLAN / RECOMMENDATIONS:   Hyperthyroidism Secondary To Graves' Disease:   - Pt with Hashi-Toxi given his prior diagnosis of hypothyroidism  - He is clinically euthyroid  - No side effects to methimazole  - Repeat TFT's today are normal, no changes -We also discussed other treatment options with radioactive iodine ablation and total thyroidectomy, we discussed that with those modalities he will need to be on lifelong LT-4 replacement therapy.  Patient would like to continue taking methimazole at this time   Medications  Continue methimazole 5 mg half a Tablet Fridays, Saturday and Sundays and 1 tablet Monday through Thursday   2. Graves' Disease:    - No extra-thyroidal manifestations of graves' disease - Up to date on eye exams    F/U in 4 months   Signed electronically by: Mack Guise, MD  [facility]  [facility] [address] [place], [place] [address] Phone: [phone] FAX: [phone]      CC: Lillard Anes, MD [address] [place]  [address] Phone: [phone]  Fax: [phone]   Return to Endocrinology clinic as below: Future Appointments  Date Time Provider [place]  12/26/2021  7:50 AM Valma Rotenberg, Melanie Crazier, MD [id number] None

## 2021-09-02 ENCOUNTER — Ambulatory Visit (INDEPENDENT_AMBULATORY_CARE_PROVIDER_SITE_OTHER): Payer: Medicare Other

## 2021-09-02 ENCOUNTER — Other Ambulatory Visit: Payer: Self-pay

## 2021-09-02 DIAGNOSIS — Z23 Encounter for immunization: Secondary | ICD-10-CM

## 2021-10-16 ENCOUNTER — Other Ambulatory Visit: Payer: Self-pay | Admitting: Legal Medicine

## 2021-11-07 DIAGNOSIS — H43393 Other vitreous opacities, bilateral: Secondary | ICD-10-CM | POA: Diagnosis not present

## 2021-11-07 DIAGNOSIS — H1045 Other chronic allergic conjunctivitis: Secondary | ICD-10-CM | POA: Diagnosis not present

## 2021-11-25 ENCOUNTER — Ambulatory Visit (INDEPENDENT_AMBULATORY_CARE_PROVIDER_SITE_OTHER): Payer: Medicare Other | Admitting: Legal Medicine

## 2021-11-25 ENCOUNTER — Encounter: Payer: Self-pay | Admitting: Legal Medicine

## 2021-11-25 VITALS — BP 106/60 | HR 85 | Temp 98.0°F | Resp 16 | Ht 67.0 in | Wt 199.0 lb

## 2021-11-25 DIAGNOSIS — D171 Benign lipomatous neoplasm of skin and subcutaneous tissue of trunk: Secondary | ICD-10-CM | POA: Insufficient documentation

## 2021-11-25 DIAGNOSIS — R051 Acute cough: Secondary | ICD-10-CM | POA: Diagnosis not present

## 2021-11-25 DIAGNOSIS — J018 Other acute sinusitis: Secondary | ICD-10-CM

## 2021-11-25 LAB — POCT INFLUENZA A/B
Influenza A, POC: NEGATIVE
Influenza B, POC: NEGATIVE

## 2021-11-25 LAB — POC COVID19 BINAXNOW: SARS Coronavirus 2 Ag: NEGATIVE

## 2021-11-25 MED ORDER — AMOXICILLIN 875 MG PO TABS: 875.0000 mg | ORAL_TABLET | Freq: Two times a day (BID) | ORAL | 0 refills | Status: AC

## 2021-11-25 MED ORDER — TRIAMCINOLONE ACETONIDE 40 MG/ML IJ SUSP
80.0000 mg | Freq: Once | INTRAMUSCULAR | Status: AC
  Administered 2021-11-25: 80 mg via INTRAMUSCULAR

## 2021-11-25 NOTE — Progress Notes (Signed)
° °Acute Office Visit ° °Subjective:  ° ° Patient ID: [name] Huff, male    DOB: 08/16/1946, 75 y.o.   MRN: [id number] ° °Chief Complaint  °Patient presents with  ° Cough  ° Sinusitis  ° ° °HPI: °Patient is in today for nasal congestion, pos drainage, chills, headache, cough, sinus pain since 3 weeks ago. He mentioned that it got better one week after but then he started to feel bad  again.  ° °Patient sees Dr shamleffer for hyperthyroidism. He takes methimazole 5 mg 1 tablet from Monday to Thursday and half a tablet Friday, sat., sun. 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   ° ° °Past Surgical History:  °Procedure Laterality Date  ° CATARACT EXTRACTION    ° TONSILECTOMY/ADENOIDECTOMY WITH MYRINGOTOMY  2020  ° ° °Family History  °Problem Relation Age of Onset  ° Thyroid disease Sister   ° Goiter Paternal Grandmother   ° Heart attack Mother   ° Colon cancer Father   °     Patient passed away unknown  ° ° °Social History  ° °Socioeconomic History  ° Marital status: Legally Separated  °  Spouse name: Not on file  ° Number of children: 0  ° Years of education: Not on file  ° Highest education level: Not on file  °Occupational History  ° Occupation: [employer]  °  Employer: [employer]  ° Occupation: Retired  °Tobacco Use  ° Smoking status: Never  ° Smokeless tobacco: Never  °Substance and Sexual Activity  ° Alcohol use: Never  ° Drug use: Never  ° Sexual activity: Yes  °  Partners: Female  °Other Topics Concern  ° Not on file  °Social History Narrative  ° Not on file  ° °Social Determinants of Health  ° °Financial Resource Strain: Not on file  °Food Insecurity: Not on file  °Transportation Needs: Not on file  °Physical Activity: Not on file  °Stress: Not on file  °Social Connections: Not on file  °Intimate Partner Violence: Not on  file  ° ° °Outpatient Medications Prior to Visit  °Medication Sig Dispense Refill  ° Albuterol Sulfate, sensor, 108 (90 Base) MCG/ACT AEPB Inhale 2 puffs into the lungs in the morning, at noon, in the evening, and at bedtime. Patient uses proair 1 each 6  ° budesonide-formoterol (SYMBICORT) 80-4.5 MCG/ACT inhaler Inhale 2 puffs into the lungs 2 (two) times daily. 1 each 3  ° diphenhydrAMINE (BENADRYL) 25 MG tablet Take 25 mg by mouth every 6 (six) hours as needed.    ° fexofenadine (ALLEGRA) 180 MG tablet Take 180 mg by mouth daily.    ° fluticasone (FLONASE) 50 MCG/ACT nasal spray USE TWO SPRAYS IN EACH NOSTRIL DAILY. 16 g 6  ° furosemide (LASIX) 20 MG tablet Take 1 tablet (20 mg total) by mouth daily as needed for edema (swelling). 10 tablet 0  ° lisinopril (ZESTRIL) 20 MG tablet TAKE ONE TABLET BY MOUTH EVERY DAY 90 tablet 2  ° methimazole (TAPAZOLE) 5 MG tablet Take 1 tablet (5 mg total) by mouth as directed. 1 tablet Monday through Thursday, and HALF a tablet Fridays, Saturday and Sundays 72 tablet 1  ° omeprazole (PRILOSEC) 40 MG capsule TAKE 1 CAPSULE BY MOUTH EVERY DAY 90 capsule 2  ° °No facility-administered medications   medications prior to visit.    No Known Allergies  Review of Systems  Constitutional:  Positive for chills. Negative for fatigue, fever and unexpected weight change.  HENT:  Positive for congestion. Negative for ear pain, sinus pain and sore throat.   Cardiovascular:  Negative for chest pain and palpitations.  Gastrointestinal:  Negative for abdominal pain, blood in stool, constipation, diarrhea, nausea and vomiting.  Endocrine: Negative for polydipsia.  Genitourinary:  Negative for dysuria.  Musculoskeletal:  Negative for back pain.  Skin:  Negative for rash.  Neurological:  Negative for headaches.      Objective:    Physical Exam Constitutional:      Appearance: Normal appearance. He is obese.  HENT:     Head: Normocephalic.     Right Ear: Tympanic membrane, ear canal and  external ear normal.     Left Ear: Tympanic membrane, ear canal and external ear normal.     Nose: Nose normal.     Mouth/Throat:     Mouth: Mucous membranes are moist.     Pharynx: Oropharynx is clear. Posterior oropharyngeal erythema (right sided) present.  Eyes:     Extraocular Movements: Extraocular movements intact.     Conjunctiva/sclera: Conjunctivae normal.     Pupils: Pupils are equal, round, and reactive to light.  Neck:     Vascular: No carotid bruit.  Cardiovascular:     Rate and Rhythm: Normal rate and regular rhythm.     Pulses: Normal pulses.     Heart sounds: No murmur heard. Pulmonary:     Effort: Pulmonary effort is normal.     Breath sounds: Normal breath sounds.  Abdominal:     General: Bowel sounds are normal.     Palpations: Abdomen is soft. There is no mass.  Musculoskeletal:        General: Normal range of motion.     Cervical back: Normal range of motion. No tenderness.     Right lower leg: No edema.     Left lower leg: No edema.  Skin:    Findings: Lesion present.     Comments: 5cm lipoma right hip growing  Neurological:     Gait: Gait normal.     Deep Tendon Reflexes: Reflexes normal.  Psychiatric:        Mood and Affect: Mood normal.        Behavior: Behavior normal.        Thought Content: Thought content normal.    BP 106/60    Pulse 85    Temp 98 F (36.7 C)    Resp 16    Ht 5' 7" (1.702 m)    Wt 199 lb (90.3 kg)    SpO2 96%    BMI 31.17 kg/m  Wt Readings from Last 3 Encounters:  11/25/21 199 lb (90.3 kg)  08/23/21 199 lb (90.3 kg)  05/15/21 204 lb (92.5 kg)    Health Maintenance Due  Topic Date Due   Hepatitis C Screening  Never done   TETANUS/TDAP  Never done   Zoster Vaccines- Shingrix (1 of 2) Never done   Pneumonia Vaccine 84+ Years old (2 - PCV) 12/23/2012    There are no preventive care reminders to display for this patient.   Lab Results  Component Value Date   TSH 2.07 08/23/2021   Lab Results  Component Value  Date   WBC 8.8 05/15/2021   HGB 16.3 05/15/2021   HCT 48.2 05/15/2021   MCV 78 (L) 05/15/2021  PLT 281 05/15/2021  ° °Lab Results  °Component Value Date  ° NA 133 (L) 05/15/2021  ° K 4.2 05/15/2021  ° CO2 22 05/15/2021  ° GLUCOSE 94 05/15/2021  ° BUN 11 05/15/2021  ° CREATININE 0.99 05/15/2021  ° BILITOT 0.4 05/15/2021  ° ALKPHOS 105 05/15/2021  ° AST 17 05/15/2021  ° ALT 18 05/15/2021  ° PROT 7.4 05/15/2021  ° ALBUMIN 4.4 05/15/2021  ° CALCIUM 9.5 05/15/2021  ° EGFR 79 05/15/2021  ° GFR 73.79 11/10/2019  ° °No results found for: CHOL °No results found for: HDL °No results found for: LDLCALC °No results found for: TRIG °No results found for: CHOLHDL °No results found for: HGBA1C ° °   °Assessment & Plan:  ° °Problem List Items Addressed This Visit   ° °  ° Respiratory  ° Sinusitis  ° Relevant Medications  ° amoxicillin (AMOXIL) 875 MG tablet °Patient has acute sinusitis with copious drainage back of the throat.  Ears are normal heart and lungs are normal.  We will treat with amoxicillin for 10 days and Kenalog 80 mg IM.  °  ° Other  ° Lipoma of back  ° Relevant Orders  ° Ambulatory referral to General Surgery °Patient has a growing lipoma over the right hip is nontender but he is concerned about it.  I will refer to general surgery Dr. Evans for removal.  ° °Other Visit Diagnoses   ° ° Acute cough    -  Primary  ° Relevant Orders  ° POC COVID-19 (Completed)- negative  ° Influenza A/B (Completed) - negative  °Care °  ° °Meds ordered this encounter  °Medications  ° amoxicillin (AMOXIL) 875 MG tablet  °  Sig: Take 1 tablet (875 mg total) by mouth 2 (two) times daily for 10 days.  °  Dispense:  20 tablet  °  Refill:  0  ° triamcinolone acetonide (KENALOG-40) injection 80 mg  ° ° °Orders Placed This Encounter  °Procedures  ° Ambulatory referral to General Surgery  ° POC COVID-19  ° Influenza A/B  °  ° °Follow-up: Return in about 3 months (around 02/23/2022) for chronic. ° °An After Visit Summary was printed and  given to the patient. ° °Lawrence Perry, MD °[facility] °[phone] °

## 2021-12-04 DIAGNOSIS — K219 Gastro-esophageal reflux disease without esophagitis: Secondary | ICD-10-CM | POA: Diagnosis not present

## 2021-12-04 DIAGNOSIS — I10 Essential (primary) hypertension: Secondary | ICD-10-CM | POA: Diagnosis not present

## 2021-12-04 DIAGNOSIS — D2121 Benign neoplasm of connective and other soft tissue of right lower limb, including hip: Secondary | ICD-10-CM | POA: Diagnosis not present

## 2021-12-26 ENCOUNTER — Ambulatory Visit: Payer: Medicare Other | Admitting: Internal Medicine

## 2021-12-26 ENCOUNTER — Other Ambulatory Visit: Payer: Self-pay

## 2021-12-26 ENCOUNTER — Encounter: Payer: Self-pay | Admitting: Internal Medicine

## 2021-12-26 VITALS — BP 124/75 | HR 65 | Ht 67.0 in | Wt 193.8 lb

## 2021-12-26 DIAGNOSIS — E0590 Thyrotoxicosis, unspecified without thyrotoxic crisis or storm: Secondary | ICD-10-CM

## 2021-12-26 DIAGNOSIS — E0500 Thyrotoxicosis with diffuse goiter without thyrotoxic crisis or storm: Secondary | ICD-10-CM

## 2021-12-26 LAB — TSH: TSH: 0.02 u[IU]/mL — ABNORMAL LOW (ref 0.35–5.50)

## 2021-12-26 LAB — T4, FREE: Free T4: 1.25 ng/dL (ref 0.60–1.60)

## 2021-12-26 MED ORDER — METHIMAZOLE 5 MG PO TABS: 5.0000 mg | ORAL_TABLET | ORAL | 2 refills | Status: DC

## 2021-12-26 NOTE — Progress Notes (Signed)
Name: [name] Huff  MRN/ DOB: [id number], November 08, 1946    Age/ Sex: 76 y.o., male     PCP: Lillard Anes, MD   Reason for Endocrinology Evaluation: Hyperthyroidism     Initial Endocrinology Clinic Visit: 05/16/2019    PATIENT IDENTIFIER: [name] is a 76 y.o., male with a past medical history of HTN. He has followed with [facility] since 05/16/2019 for consultative assistance with management of his Hyperthyroidism      HISTORICAL SUMMARY: The patient was first diagnosed with hypothyroidism many years ago, he was  on LT-4 replacement for many years up to 150 mcg daily,  but in 2019 his dose has been gradually reduced and was off for a few months prior to his presentation to our clinic.    He was diagnosed with hyperthyroidism secondary to graves' disease in 05/2019 and started on Methimazole     Sister with thyroid disease.  SUBJECTIVE:    Today (12/26/2021):  [name] Huff is here for a follow up on hyperthyroidism. 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 He has never used smokeless tobacco. He reports that he does not drink alcohol and does not use drugs. Family History:  Family History  Problem  Relation Age of Onset   Thyroid disease Sister    Goiter Paternal Grandmother    Heart attack Mother    Colon cancer Father        Patient passed away unknown     HOME MEDICATIONS: Allergies as of 12/26/2021   No Known Allergies      Medication List        Accurate as of December 26, 2021  7:54 AM. If you have any questions, ask your nurse or doctor.          Albuterol Sulfate (sensor) 108 (90 Base) MCG/ACT Aepb Inhale 2 puffs into the lungs in the morning, at noon, in the evening, and at bedtime. Patient uses proair   budesonide-formoterol 80-4.5 MCG/ACT inhaler Commonly known as: SYMBICORT Inhale 2 puffs into the lungs 2 (two) times daily.   diphenhydrAMINE 25 MG tablet Commonly known as: BENADRYL Take 25 mg by mouth every 6 (six) hours as needed.   fexofenadine 180 MG tablet Commonly known as: ALLEGRA Take 180 mg by mouth daily.   fluticasone 50 MCG/ACT nasal spray Commonly known as: FLONASE USE TWO SPRAYS IN EACH NOSTRIL DAILY.   furosemide 20 MG tablet Commonly known as: LASIX Take 1 tablet (20 mg total) by mouth daily as needed for edema (swelling).   lisinopril 20 MG tablet Commonly known as: ZESTRIL TAKE ONE TABLET BY MOUTH EVERY DAY  methimazole 5 MG tablet Commonly known as: TAPAZOLE Take 1 tablet (5 mg total) by mouth as directed. 1 tablet Monday through Thursday, and HALF a tablet Fridays, Saturday and Sundays   omeprazole 40 MG capsule Commonly known as: PRILOSEC TAKE 1 CAPSULE BY MOUTH EVERY DAY          OBJECTIVE:   PHYSICAL EXAM: VS: BP 124/75 (BP Location: Left Arm, Patient Position: Sitting, Cuff Size: Small)    Pulse 65    Ht 5\' 7"  (1.702 m)    Wt 193 lb 12.8 oz (87.9 kg)    SpO2 99%    BMI 30.35 kg/m    EXAM: General: Pt appears well and is in NAD  Neck: General: Supple without adenopathy. Thyroid: Left-sided asymmetry noted on exam today  Lungs: Clear with good BS bilat with no rales, rhonchi, or wheezes  Heart:  Auscultation: RRR.  Abdomen: Normoactive bowel sounds, soft, nontender, without masses or organomegaly palpable  Extremities:  BL LE: No pretibial edema normal ROM and strength.  Mental Status: Judgment, insight: Intact Orientation: Oriented to time, place, and person Mood and affect: No depression, anxiety, or agitation     DATA REVIEWED:    Latest Reference Range & Units 12/26/21 08:11  TSH 0.35 - 5.50 uIU/mL 0.02 (L)  T4,Free(Direct) 0.60 - 1.60 ng/dL 1.25     Results for [name] Huff, [name] Huff (MRN [id number]) as of 08/23/2021 12:47  Ref. Range 08/23/2021 08:26  TSH Latest Ref Range: 0.35 - 5.50 uIU/mL 2.07  T4,Free(Direct) Latest Ref Range: 0.60 - 1.60 ng/dL 0.77    Results for [name] Huff, [name] Huff (MRN [id number]) as of 08/16/2019 13:51  Ref. Range 05/16/2019 15:04  TRAB Latest Ref Range: <=2.00 IU/L >40.00 (H)   ASSESSMENT / PLAN / RECOMMENDATIONS:   Hyperthyroidism Secondary To Graves' Disease:   - Pt with Hashi-Toxi given his prior diagnosis of hypothyroidism  -His TSH has decreased again, the patient assures me compliance with methimazole intake.  I did contact urgent healthcare pharmacy and the last pickup of methimazole was September, but the patient stated that he has picked up a prescription from [facility] in October. -I have asked Mrs. Coalson to start seriously think about radioactive iodine ablation given the difficulty in controlling his thyroid function with methimazole.  We also discussed other treatment options with  total thyroidectomy, we discussed that with those modalities he will need to be on lifelong LT-4 replacement therapy.   -He has been noted with left thyroid asymmetry on exam today, he was given a printed order for thyroid ultrasound to take to [facility]  Medications  Increase methimazole 5 mg, half a Tablet on  Sundays and 1 tablet Monday through Saturday      2. Graves' Disease:    - No extra-thyroidal manifestations of graves'  disease     F/U in 6 months   Addendum: Discussed lab results with the patient on 12/26/2021 at 1530.  Discussed my concern with the difficulty of controlling his TFTs and I have strongly urged him to consider radioactive iodine ablation..  Patient lives in [place] and I have asked him to see if he can have his TFTs rechecked in 2 months at PCPs office   Signed electronically by: Mack Guise, MD  [facility]  [facility] [address] [place], [place] [address] Phone: [phone] FAX: [phone]      CC: Lillard Anes, MD [address] [place] [address] Phone: [phone]  Fax: [phone]   Return to Endocrinology clinic as below: No future appointments.

## 2022-01-03 DIAGNOSIS — E0500 Thyrotoxicosis with diffuse goiter without thyrotoxic crisis or storm: Secondary | ICD-10-CM | POA: Diagnosis not present

## 2022-01-03 DIAGNOSIS — E041 Nontoxic single thyroid nodule: Secondary | ICD-10-CM | POA: Diagnosis not present

## 2022-01-03 DIAGNOSIS — E0590 Thyrotoxicosis, unspecified without thyrotoxic crisis or storm: Secondary | ICD-10-CM | POA: Diagnosis not present

## 2022-01-10 ENCOUNTER — Telehealth: Payer: Self-pay | Admitting: Internal Medicine

## 2022-01-10 DIAGNOSIS — E041 Nontoxic single thyroid nodule: Secondary | ICD-10-CM

## 2022-01-10 NOTE — Telephone Encounter (Signed)
Contacted the pt on 01/10/2021 @ 01/10/2022 ? ?Discussed the need to proceed with left thyroid nodule FNA 2.6 cm x 1.8 x 1.9 ? ? ? ?Patient in agreement of this ? ?We will schedule at [place] ? ? ?Mack Guise, MD ? ?[facility]  ?[facility] ?[address] ?[place], [place] [address] ?Phone: [phone] ?FAX: [phone] ? ? ? ? ?

## 2022-01-31 ENCOUNTER — Telehealth: Payer: Self-pay

## 2022-01-31 NOTE — Telephone Encounter (Signed)
Patient has been notified of his appointment at [facility] for biopsy. Patient given number to call if he needs to make any changes.  ?

## 2022-02-05 DIAGNOSIS — E041 Nontoxic single thyroid nodule: Secondary | ICD-10-CM | POA: Diagnosis not present

## 2022-02-05 DIAGNOSIS — D440 Neoplasm of uncertain behavior of thyroid gland: Secondary | ICD-10-CM | POA: Diagnosis not present

## 2022-03-03 ENCOUNTER — Telehealth: Payer: Self-pay

## 2022-03-03 NOTE — Telephone Encounter (Addendum)
[name] Huff from [facility] will fax over the biopsy  report  done on 02/05/22. Patient is aware and will wait to  get results  ?

## 2022-03-04 NOTE — Telephone Encounter (Signed)
Duplicate

## 2022-03-05 ENCOUNTER — Telehealth: Payer: Self-pay

## 2022-03-05 NOTE — Telephone Encounter (Signed)
I have called [facility] on 03/04/2022 and was transferred to the pathology department.  I left a voicemail with my contact information to obtain patient's results ? ? ? ?Called back on 03/05/2022 at 11:30 AM ?Switchboard transfer me to cytology department, no answer after more than a minute the call transferred back to the switchboard ?Switchboard transferred me to radiology department, spoke to Manuela Schwartz provided her with my personal information to see how I can obtain the ThyroSeq results ? ? ? ?Mack Guise, MD ? ?[facility]  ?[facility] ?[address] ?[place], [place] [address] ?Phone: [phone] ?FAX: [phone] ? ?

## 2022-03-05 NOTE — Telephone Encounter (Signed)
Left a message for the pt to call back on 03/05/2022 ? ?Awaiting call back.  ? ? ? ?

## 2022-03-05 NOTE — Telephone Encounter (Signed)
Patient calling to get result for the biopsy. Result should be in chart   ?

## 2022-03-07 NOTE — Telephone Encounter (Signed)
Message left for [name] Huff in medical records to fax report to our office.  ?

## 2022-03-11 NOTE — Telephone Encounter (Signed)
Patient has been notified and will have labs rechecked at primary care  ?

## 2022-03-11 NOTE — Telephone Encounter (Signed)
Report has been scanned and sent to you for review  ?

## 2022-03-11 NOTE — Telephone Encounter (Signed)
LMTCB for result  ?

## 2022-03-11 NOTE — Telephone Encounter (Signed)
Spoke with Shirlean Mylar in pathology  and she is going to take the thyroseq report to medical records and have them fax it.  ?

## 2022-04-23 ENCOUNTER — Ambulatory Visit (INDEPENDENT_AMBULATORY_CARE_PROVIDER_SITE_OTHER): Payer: Medicare Other | Admitting: Legal Medicine

## 2022-04-23 ENCOUNTER — Encounter: Payer: Self-pay | Admitting: Legal Medicine

## 2022-04-23 VITALS — BP 110/80 | HR 77 | Temp 98.0°F | Resp 15 | Ht 67.0 in | Wt 202.0 lb

## 2022-04-23 DIAGNOSIS — J0100 Acute maxillary sinusitis, unspecified: Secondary | ICD-10-CM | POA: Diagnosis not present

## 2022-04-23 DIAGNOSIS — E0500 Thyrotoxicosis with diffuse goiter without thyrotoxic crisis or storm: Secondary | ICD-10-CM | POA: Diagnosis not present

## 2022-04-23 MED ORDER — AMOXICILLIN 875 MG PO TABS: 875.0000 mg | ORAL_TABLET | Freq: Two times a day (BID) | ORAL | 0 refills | Status: AC

## 2022-04-23 MED ORDER — TRIAMCINOLONE ACETONIDE 40 MG/ML IJ SUSP
80.0000 mg | Freq: Once | INTRAMUSCULAR | Status: AC
  Administered 2022-04-23: 80 mg via INTRAMUSCULAR

## 2022-04-23 NOTE — Progress Notes (Signed)
Acute Office Visit  Subjective:    Patient ID: [name] Huff, male    DOB: 1945-11-26, 76 y.o.   MRN: [id number]  Chief Complaint  Patient presents with   Sinusitis    HPI: Patient is in today for sinus pressure, sinus headache, pos nasal drip since 3 weeks ago. He has tried Human resources officer and nasal spray, but it did not help.no fever or chills.  Using OTC medicines.  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     Past Surgical History:  Procedure Laterality Date   CATARACT EXTRACTION     TONSILECTOMY/ADENOIDECTOMY WITH MYRINGOTOMY  2020    Family History  Problem Relation Age of Onset   Thyroid disease Sister    Goiter Paternal Grandmother    Heart attack Mother    Colon cancer Father        Patient passed away unknown    Social History   Socioeconomic History   Marital status: Legally Separated    Spouse name: Not on file   Number of children: 0   Years of education: Not on file   Highest education level: Not on file  Occupational History   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[employer]   Occupation: Retired  Tobacco Use   Smoking status: Never   Smokeless tobacco: Never  Substance and Sexual Activity   Alcohol use: Never   Drug use: Never   Sexual activity: Yes    Partners: Female  Other Topics Concern   Not on file  Social History Narrative   Not on file   Social Determinants of Health   Financial Resource Strain: Not on file  Food Insecurity: Not on file  Transportation Needs: Not on file  Physical Activity: Not on file  Stress: Not on file  Social Connections: Not on file  Intimate Partner Violence: Not on file    Outpatient Medications Prior to Visit  Medication Sig Dispense Refill   fexofenadine (ALLEGRA) 180 MG tablet Take 180 mg by mouth daily.     fluticasone  (FLONASE) 50 MCG/ACT nasal spray USE TWO SPRAYS IN EACH NOSTRIL DAILY. 16 g 6   lisinopril (ZESTRIL) 20 MG tablet TAKE ONE TABLET BY MOUTH EVERY DAY 90 tablet 2   methimazole (TAPAZOLE) 5 MG tablet Take 1 tablet (5 mg total) by mouth as directed. 1 tablet Monday through Saturday , and HALF a tablet  Sundays 85 tablet 2   omeprazole (PRILOSEC) 40 MG capsule TAKE 1 CAPSULE BY MOUTH EVERY DAY 90 capsule 2   Albuterol Sulfate, sensor, 108 (90 Base) MCG/ACT AEPB Inhale 2 puffs into the lungs in the morning, at noon, in the evening, and at bedtime. Patient uses proair (Patient not taking: Reported on 12/26/2021) 1 each 6   budesonide-formoterol (SYMBICORT) 80-4.5 MCG/ACT inhaler Inhale 2 puffs into the lungs 2 (two) times daily. (Patient not taking: Reported on 12/26/2021) 1 each 3   diphenhydrAMINE (BENADRYL) 25 MG tablet Take 25 mg by mouth every 6 (six) hours as needed. (Patient not taking: Reported on 12/26/2021)     furosemide (LASIX) 20 MG tablet Take 1 tablet (20 mg total) by mouth daily as needed for edema (swelling). (Patient not taking: Reported on 12/26/2021) 10 tablet 0   No facility-administered medications prior to visit.    No Known Allergies  Review of Systems  Constitutional:  Negative for chills, fatigue, fever and unexpected weight change.  HENT:  Positive for congestion, postnasal drip, sinus pressure and sinus pain. Negative for ear pain and sore throat.   Eyes:  Negative for visual disturbance.  Respiratory:  Negative for cough and shortness of breath.   Cardiovascular:  Negative for chest pain and palpitations.  Gastrointestinal:  Negative for abdominal pain, blood in stool, constipation, diarrhea, nausea and vomiting.  Endocrine: Negative for polydipsia.  Genitourinary:  Negative for dysuria.  Musculoskeletal:  Negative for back pain.  Skin:  Negative for rash.  Neurological:  Negative for headaches.       Objective:    Physical Exam Vitals reviewed.  Constitutional:       Appearance: Normal appearance.  HENT:     Head: Normocephalic.     Right Ear: Tympanic membrane normal.     Left Ear: Tympanic membrane normal.     Nose: Nose normal.     Mouth/Throat:     Mouth: Mucous membranes are moist.     Pharynx: Oropharynx is clear.  Eyes:     Extraocular Movements: Extraocular movements intact.     Conjunctiva/sclera: Conjunctivae normal.     Pupils: Pupils are equal, round, and reactive to light.  Cardiovascular:     Rate and Rhythm: Normal rate and regular rhythm.     Pulses: Normal pulses.     Heart sounds: Normal heart sounds. No murmur heard.    No gallop.  Pulmonary:     Effort: Pulmonary effort is normal. No respiratory distress.     Breath sounds: Normal breath sounds. No wheezing.  Abdominal:     General: Abdomen is flat. Bowel sounds are normal. There is no distension.     Palpations: Abdomen is soft.     Tenderness: There is no abdominal tenderness.  Musculoskeletal:     Cervical back: Normal range of motion and neck supple.  Skin:    General: Skin is warm.     Capillary Refill: Capillary refill takes less than 2 seconds.  Neurological:     General: No focal deficit present.     Mental Status: He is alert. Mental status is at baseline.     BP 110/80   Pulse 77   Temp 98 F (36.7 C)   Resp 15   Ht 5\' 7"  (1.702 m)   Wt 202 lb (91.6 kg)   SpO2 97%   BMI 31.64 kg/m  Wt Readings from Last 3 Encounters:  04/23/22 202 lb (91.6 kg)  12/26/21 193 lb 12.8 oz (87.9 kg)  11/25/21 199 lb (90.3 kg)    Health Maintenance Due  Topic Date Due   Hepatitis C Screening  Never done   TETANUS/TDAP  Never done   Zoster Vaccines- Shingrix (1 of 2) Never done   Pneumonia Vaccine 60+ Years old (2 - PCV) 12/23/2012    There are no preventive care reminders to display for this patient.   Lab Results  Component Value Date   TSH 5.760 (H) 04/23/2022   Lab Results  Component Value Date   WBC 8.8 05/15/2021   HGB 16.3 05/15/2021   HCT  48.2 05/15/2021   MCV 78 (L) 05/15/2021   PLT 281 05/15/2021   Lab Results  Component Value Date   NA 133 (L) 05/15/2021   K 4.2 05/15/2021   CO2 22 05/15/2021   GLUCOSE 94 05/15/2021   BUN 11 05/15/2021   CREATININE 0.99 05/15/2021  BILITOT 0.4 05/15/2021   ALKPHOS 105 05/15/2021   AST 17 05/15/2021   ALT 18 05/15/2021   PROT 7.4 05/15/2021   ALBUMIN 4.4 05/15/2021   CALCIUM 9.5 05/15/2021   EGFR 79 05/15/2021   GFR 73.79 11/10/2019   No results found for: "CHOL" No results found for: "HDL" No results found for: "LDLCALC" No results found for: "TRIG" No results found for: "CHOLHDL" No results found for: "HGBA1C"     Assessment & Plan:   Problem List Items Addressed This Visit       Respiratory   Sinusitis   Relevant Medications   amoxicillin (AMOXIL) 875 MG tablet Patient has acute sinusitis, treat with kenalog $RemoveBefo'80mg'AjNCmisaEqP$  and amoxicillin     Endocrine   Graves disease - Primary   Relevant Orders   Thyroid Panel With TSH (Completed) Draw blood for his endocrinoslogist   Meds ordered this encounter  Medications   triamcinolone acetonide (KENALOG-40) injection 80 mg   amoxicillin (AMOXIL) 875 MG tablet    Sig: Take 1 tablet (875 mg total) by mouth 2 (two) times daily for 10 days.    Dispense:  20 tablet    Refill:  0    Orders Placed This Encounter  Procedures   Thyroid Panel With TSH     Follow-up: No follow-ups on file.  An After Visit Summary was printed and given to the patient.  Reinaldo Meeker, MD [facility] [phone]

## 2022-04-24 LAB — THYROID PANEL WITH TSH
Free Thyroxine Index: 1.4 (ref 1.2–4.9)
T3 Uptake Ratio: 24 % (ref 24–39)
T4, Total: 5.7 ug/dL (ref 4.5–12.0)
TSH: 5.76 u[IU]/mL — ABNORMAL HIGH (ref 0.450–4.500)

## 2022-04-25 NOTE — Progress Notes (Signed)
TSH high 5.76, T4 normal lp

## 2022-04-28 ENCOUNTER — Telehealth: Payer: Self-pay

## 2022-04-28 NOTE — Telephone Encounter (Signed)
Patient had thyroid panel checked at his pcp office and would like to know if he needs to adjust medication.

## 2022-04-29 NOTE — Telephone Encounter (Signed)
Patient states that he was taking medication as prescribed with half on Sunday and 1 tablet Monday through Saturday. He will stop taking his dose on Sunday.

## 2022-06-26 ENCOUNTER — Ambulatory Visit: Payer: Medicare Other | Admitting: Internal Medicine

## 2022-06-26 ENCOUNTER — Encounter: Payer: Self-pay | Admitting: Internal Medicine

## 2022-06-26 VITALS — BP 118/76 | HR 67 | Ht 67.0 in | Wt 202.0 lb

## 2022-06-26 DIAGNOSIS — E0500 Thyrotoxicosis with diffuse goiter without thyrotoxic crisis or storm: Secondary | ICD-10-CM | POA: Diagnosis not present

## 2022-06-26 DIAGNOSIS — E041 Nontoxic single thyroid nodule: Secondary | ICD-10-CM

## 2022-06-26 DIAGNOSIS — E0590 Thyrotoxicosis, unspecified without thyrotoxic crisis or storm: Secondary | ICD-10-CM | POA: Diagnosis not present

## 2022-06-26 LAB — T4, FREE: Free T4: 1.06 ng/dL (ref 0.60–1.60)

## 2022-06-26 LAB — TSH: TSH: 0.08 u[IU]/mL — ABNORMAL LOW (ref 0.35–5.50)

## 2022-06-26 NOTE — Progress Notes (Signed)
Name: [name] Huff  MRN/ DOB: [id number], [birth year]    Age/ Sex: 76 y.o., male     PCP: Lillard Anes, MD   Reason for Endocrinology Evaluation: Hyperthyroidism     Initial Endocrinology Clinic Visit: 05/16/2019    PATIENT IDENTIFIER: [name] is a 76 y.o., male with a past medical history of HTN. He has followed with [facility] since 05/16/2019 for consultative assistance with management of his Hyperthyroidism      HISTORICAL SUMMARY: The patient was first diagnosed with hypothyroidism many years ago, he was  on LT-4 replacement for many years up to 150 mcg daily,  but in 2019 his dose has been gradually reduced and was off for a few months prior to his presentation to our clinic.    He was diagnosed with hyperthyroidism secondary to graves' disease in 05/2019 and started on Methimazole   Thyroid ultrasound at [facility] in February, 2023 revealed a solitary left thyroid nodule at 2.6 cm, meeting FNA criteria.  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  SUBJECTIVE:    Today (06/26/2022):  Mr. [name] is here for a follow up on hyperthyroidism. 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 He has never used smokeless tobacco. He reports that he does not drink alcohol and does not use drugs. Family History:  Family History  Problem Relation Age of Onset   Thyroid disease Sister    Goiter Paternal Grandmother    Heart attack Mother    Colon cancer Father        Patient passed away unknown     HOME MEDICATIONS: Allergies as of 06/26/2022   No Known Allergies      Medication List        Accurate as of June 26, 2022  8:06 AM. If you have any questions, ask your nurse or doctor.          Albuterol Sulfate (sensor) 108 (90 Base) MCG/ACT Aepb Inhale 2 puffs into the lungs in the morning, at noon, in the evening, and at bedtime. Patient uses proair   budesonide-formoterol 80-4.5 MCG/ACT inhaler Commonly known as: SYMBICORT Inhale 2 puffs into the lungs 2 (two) times daily.   fexofenadine 180 MG tablet Commonly known as: ALLEGRA Take 180 mg by mouth daily.   fluticasone 50 MCG/ACT nasal spray Commonly known as: FLONASE USE TWO SPRAYS IN EACH NOSTRIL DAILY.   lisinopril 20 MG tablet Commonly known as: ZESTRIL TAKE ONE TABLET BY MOUTH EVERY DAY   methimazole 5 MG tablet Commonly known as: TAPAZOLE Take 1 tablet (5 mg total) by mouth as directed. 1 tablet Monday through Saturday ,  and HALF a tablet  Sundays   omeprazole 40 MG capsule Commonly known as: PRILOSEC TAKE 1 CAPSULE BY MOUTH EVERY DAY          OBJECTIVE:   PHYSICAL EXAM: VS: BP 118/76 (BP Location: Left Arm, Patient Position: Sitting, Cuff Size: Small)   Pulse 67   Ht '5\' 7"'$  (1.702 m)   Wt 202 lb (91.6 kg)   SpO2 99%   BMI 31.64 kg/m    EXAM: General: Pt appears well and is in NAD  Neck: General: Supple without adenopathy. Thyroid: Left-sided asymmetry noted on exam today  Lungs: Clear with good BS bilat with no rales, rhonchi, or wheezes  Heart: Auscultation: RRR.  Abdomen: Normoactive bowel sounds, soft, nontender,  without masses or organomegaly palpable  Extremities:  BL LE: No pretibial edema normal ROM and strength.  Mental Status: Judgment, insight: Intact Orientation: Oriented to time, place, and person Mood and affect: No depression, anxiety, or agitation     DATA REVIEWED:   Latest Reference Range & Units 04/23/22 11:52 06/26/22 08:26  TSH 0.35 - 5.50 uIU/mL 5.760 (H) 0.08 (L)  Triiodothyronine (T3) 76 - 181 ng/dL  174  T4,Free(Direct) 0.60 - 1.60 ng/dL  1.06  Thyroxine (T4) 4.5 - 12.0 ug/dL 5.7   Free Thyroxine Index 1.2 - 4.9  1.4   T3 Uptake Ratio 24 - 39 % 24    Results for ADDAM, GOELLER (MRN [id number]) as of 08/16/2019 13:51  Ref. Range 05/16/2019 15:04  TRAB Latest Ref Range: <=2.00 IU/L >40.00 (H)   Thyroid ultrasound 12/30/2021  Nodule #1 Left superior, 2.6 x 1.8 x 1.9 cm, solid almost completely solid, isoechoic ASSESSMENT / PLAN / RECOMMENDATIONS:   Hyperthyroidism Secondary To Graves' Disease:   - Pt with Hashi-Toxi given his prior diagnosis of hypothyroidism  -His TSH has decreased down from 5.76 u IU/mL 2 months ago by just decreasing his methimazole dose by half a tablet a week.  At this time I question assay interference as his labs here are done through [facility] and at his PCP through [facility] -I am not going to make any changes at this time but he will be asked to recheck his TFTs through his PCP([facility]) in 4 weeks and proceed from there -We also discussed that his hyperthyroidism historically has been due to Graves' disease but another contributing factor may be a toxic thyroid nodule.  We discussed alternative treatments to include radioactive iodine ablation versus surgical intervention  Medications  Continue methimazole 5 mg, 1 tablet Monday through Saturday (skip Sundays)      2. Graves' Disease:    - No extra-thyroidal manifestations of graves' disease   3.  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

## 2022-06-27 ENCOUNTER — Telehealth: Payer: Self-pay | Admitting: Internal Medicine

## 2022-06-27 LAB — T3: T3, Total: 174 ng/dL (ref 76–181)

## 2022-06-27 NOTE — Telephone Encounter (Signed)
Patient notified and verbalized understanding. 

## 2022-06-27 NOTE — Telephone Encounter (Signed)
LVM for a call back regarding labs and medication adjustments

## 2022-06-27 NOTE — Telephone Encounter (Signed)
Please let the patient know that his thyroid test shows it slightly overactive, but I did discuss with him during his office visit yesterday that there may be some lab inaccuracy because when he does it at his PCPs office it looks like it is underactive and when he checks it at our office it is overactive    My suggestion is to continue current dose of methimazole 5 mg, 1 tablet Monday through Saturday (takes nothing on Sundays) and have his blood test rechecked at his PCPs office in 4 weeks and then we will determine the dose  and lab issue     Thanks

## 2022-07-15 ENCOUNTER — Other Ambulatory Visit: Payer: Self-pay | Admitting: Legal Medicine

## 2022-07-22 ENCOUNTER — Telehealth: Payer: Self-pay

## 2022-07-22 DIAGNOSIS — R051 Acute cough: Secondary | ICD-10-CM | POA: Diagnosis not present

## 2022-07-22 DIAGNOSIS — R519 Headache, unspecified: Secondary | ICD-10-CM | POA: Diagnosis not present

## 2022-07-22 NOTE — Telephone Encounter (Signed)
Patient called stating he has had sinus symptoms for the past 2 weeks, felt he was improving but feels worse again, asked if Dr. Henrene Pastor could just send him in a antibiotic. Advised patient that in order for him to receive the best care he needed to be evaluated here in the office. Offered a 8:40 a.m.appointment for tomorrow,however he declined and stated he would go to Urgent Care.

## 2022-08-06 ENCOUNTER — Other Ambulatory Visit: Payer: Self-pay | Admitting: Internal Medicine

## 2022-08-06 ENCOUNTER — Telehealth: Payer: Self-pay

## 2022-08-06 DIAGNOSIS — E0590 Thyrotoxicosis, unspecified without thyrotoxic crisis or storm: Secondary | ICD-10-CM

## 2022-08-06 NOTE — Telephone Encounter (Signed)
Patient needs to have labs put in so that they can check the at his primary care office.  He was advise to recheck in 4 weeks.

## 2022-08-08 ENCOUNTER — Other Ambulatory Visit: Payer: Medicare Other

## 2022-08-08 NOTE — Telephone Encounter (Signed)
Patient notified that orders are in 

## 2022-08-11 ENCOUNTER — Other Ambulatory Visit: Payer: Medicare Other

## 2022-08-11 ENCOUNTER — Other Ambulatory Visit: Payer: Self-pay

## 2022-08-11 DIAGNOSIS — E0590 Thyrotoxicosis, unspecified without thyrotoxic crisis or storm: Secondary | ICD-10-CM

## 2022-08-12 ENCOUNTER — Telehealth: Payer: Self-pay | Admitting: Internal Medicine

## 2022-08-12 DIAGNOSIS — E0590 Thyrotoxicosis, unspecified without thyrotoxic crisis or storm: Secondary | ICD-10-CM

## 2022-08-12 LAB — T3: T3, Total: 192 ng/dL — ABNORMAL HIGH (ref 71–180)

## 2022-08-12 LAB — T4, FREE: Free T4: 1.29 ng/dL (ref 0.82–1.77)

## 2022-08-12 LAB — TSH: TSH: 0.051 u[IU]/mL — ABNORMAL LOW (ref 0.450–4.500)

## 2022-08-12 MED ORDER — METHIMAZOLE 5 MG PO TABS: 5.0000 mg | ORAL_TABLET | Freq: Every day | ORAL | 2 refills | Status: DC

## 2022-08-12 NOTE — Telephone Encounter (Signed)
I have attempted to contact the patient on 08/12/2022 at 1400   There was no answer, I have left a message to  INCREASE methimazole 5 mg daily including Sundays from now on    TFTs entered through [facility] so he can have it done in 2 months at PCPs office   Hudson, MD  [facility]  [address] [place], [place] [address] Phone: [phone] FAX: [phone]

## 2022-09-01 ENCOUNTER — Ambulatory Visit (INDEPENDENT_AMBULATORY_CARE_PROVIDER_SITE_OTHER): Payer: Medicare Other

## 2022-09-01 DIAGNOSIS — Z23 Encounter for immunization: Secondary | ICD-10-CM

## 2022-09-23 ENCOUNTER — Encounter: Payer: Self-pay | Admitting: Legal Medicine

## 2022-09-23 ENCOUNTER — Ambulatory Visit (INDEPENDENT_AMBULATORY_CARE_PROVIDER_SITE_OTHER): Payer: Medicare Other | Admitting: Legal Medicine

## 2022-09-23 VITALS — BP 130/72 | HR 85 | Temp 97.6°F | Resp 14 | Ht 67.0 in | Wt 209.0 lb

## 2022-09-23 DIAGNOSIS — H6691 Otitis media, unspecified, right ear: Secondary | ICD-10-CM

## 2022-09-23 DIAGNOSIS — H6690 Otitis media, unspecified, unspecified ear: Secondary | ICD-10-CM | POA: Insufficient documentation

## 2022-09-23 MED ORDER — TRIAMCINOLONE ACETONIDE 40 MG/ML IJ SUSP
60.0000 mg | Freq: Once | INTRAMUSCULAR | Status: AC
  Administered 2022-09-23: 60 mg via INTRAMUSCULAR

## 2022-09-23 MED ORDER — AMOXICILLIN-POT CLAVULANATE 875-125 MG PO TABS: 1.0000 | ORAL_TABLET | Freq: Two times a day (BID) | ORAL | 0 refills | Status: DC

## 2022-09-23 NOTE — Progress Notes (Signed)
Acute Office Visit  Subjective:    Patient ID: [name] Huff, male    DOB: [birth year], 76 y.o.   MRN: [id number]  Chief Complaint  Patient presents with   Ear Fullness   Sinusitis    HPI: Patient is in today for ear fullness on both ears especially on right ear and sinus pressure, nasal congestion since 2 weeks. He took doxycycline for sinusitis 3 weeks ago and it helped a little bit.  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     Past Surgical History:  Procedure Laterality Date   CATARACT EXTRACTION     TONSILECTOMY/ADENOIDECTOMY WITH MYRINGOTOMY  2020    Family History  Problem Relation Age of Onset   Thyroid disease Sister    Goiter Paternal Grandmother    Heart attack Mother    Colon cancer Father        Patient passed away unknown    Social History   Socioeconomic History   Marital status: Legally Separated    Spouse name: Not on file   Number of children: 0   Years of education: Not on file   Highest education level: Not on file  Occupational History   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[employer]   Occupation: Retired  Tobacco Use   Smoking status: Never   Smokeless tobacco: Never  Substance and Sexual Activity   Alcohol use: Never   Drug use: Never   Sexual activity: Yes    Partners: Female  Other Topics Concern   Not on file  Social History Narrative   Not on file   Social Determinants of Health   Financial Resource Strain: Not on file  Food Insecurity: Not on file  Transportation Needs: Not on file  Physical Activity: Not on file  Stress: Not on file  Social Connections: Not on file  Intimate Partner Violence: Not on file    Outpatient Medications Prior to Visit  Medication Sig Dispense Refill   fexofenadine (ALLEGRA) 180 MG tablet Take 180 mg by mouth  daily.     fluticasone (FLONASE) 50 MCG/ACT nasal spray USE TWO SPRAYS IN EACH NOSTRIL DAILY. 16 g 6   lisinopril (ZESTRIL) 20 MG tablet TAKE 1 TABLET BY MOUTH EVERY DAY 90 tablet 2   methimazole (TAPAZOLE) 5 MG tablet Take 1 tablet (5 mg total) by mouth daily. 1 tablet Monday through Saturday , and HALF a tablet  Sundays 90 tablet 2   omeprazole (PRILOSEC) 40 MG capsule TAKE 1 CAPSULE BY MOUTH EVERY DAY 90 capsule 2   Albuterol Sulfate, sensor, 108 (90 Base) MCG/ACT AEPB Inhale 2 puffs into the lungs in the morning, at noon, in the evening, and at bedtime. Patient uses proair (Patient not taking: Reported on 12/26/2021) 1 each 6   budesonide-formoterol (SYMBICORT) 80-4.5 MCG/ACT inhaler Inhale 2 puffs into the lungs 2 (two) times daily. (Patient not taking: Reported on 12/26/2021) 1 each 3   No facility-administered medications prior to visit.    No Known Allergies  Review of Systems  Constitutional:  Negative for chills, fatigue, fever and unexpected weight change.  HENT:  Positive for congestion, sinus pressure and sinus pain. Negative for ear pain and sore throat.   Respiratory:  Positive for cough.   Cardiovascular:  Negative for chest pain and palpitations.  Gastrointestinal:  Negative for abdominal pain, blood in stool, constipation, diarrhea, nausea and vomiting.  Endocrine: Negative for polydipsia.  Genitourinary:  Negative for dysuria.  Musculoskeletal:  Negative for back pain.  Skin:  Negative for rash.  Neurological:  Negative for headaches.       Objective:    Physical Exam Vitals reviewed.  Constitutional:      Appearance: Normal appearance. He is normal weight.  HENT:     Head: Normocephalic and atraumatic.     Ears:     Comments: Right ear TM opaque, left ear som    Nose: Congestion present.  Eyes:     Extraocular Movements: Extraocular movements intact.     Conjunctiva/sclera: Conjunctivae normal.     Pupils: Pupils are equal, round, and reactive to light.   Cardiovascular:     Rate and Rhythm: Normal rate and regular rhythm.     Pulses: Normal pulses.     Heart sounds: Normal heart sounds. No murmur heard.    No gallop.  Pulmonary:     Effort: Pulmonary effort is normal. No respiratory distress.     Breath sounds: Normal breath sounds. No wheezing.  Abdominal:     General: Abdomen is flat. Bowel sounds are normal. There is no distension.     Palpations: Abdomen is soft.     Tenderness: There is no abdominal tenderness.  Musculoskeletal:        General: Normal range of motion.     Cervical back: Normal range of motion and neck supple.  Skin:    General: Skin is warm.     Capillary Refill: Capillary refill takes less than 2 seconds.  Neurological:     General: No focal deficit present.     Mental Status: He is alert and oriented to person, place, and time. Mental status is at baseline.     BP 130/72   Pulse 85   Temp 97.6 F (36.4 C)   Resp 14   Ht _0  (1.702 m)   Wt 209 lb (94.8 kg)   SpO2 97%   BMI 32.73 kg/m  Wt Readings from Last 3 Encounters:  09/23/22 209 lb (94.8 kg)  06/26/22 202 lb (91.6 kg)  04/23/22 202 lb (91.6 kg)    Health Maintenance Due  Topic Date Due   Hepatitis C Screening  Never done   TETANUS/TDAP  Never done   Zoster Vaccines- Shingrix (1 of 2) Never done   Pneumonia Vaccine 68+ Years old (2 - PCV) 12/23/2012   Medicare Annual Wellness (AWV)  11/06/2021    There are no preventive care reminders to display for this patient.   Lab Results  Component Value Date   TSH 0.051 (L) 08/11/2022   Lab Results  Component Value Date   WBC 8.8 05/15/2021   HGB 16.3 05/15/2021   HCT 48.2 05/15/2021   MCV 78 (L) 05/15/2021   PLT 281 05/15/2021   Lab Results  Component Value Date   NA 133 (L) 05/15/2021   K 4.2 05/15/2021   CO2 22 05/15/2021   GLUCOSE 94 05/15/2021   BUN 11 05/15/2021   CREATININE 0.99 05/15/2021   BILITOT 0.4 05/15/2021   ALKPHOS 105 05/15/2021   AST 17 05/15/2021   ALT  18 05/15/2021   PROT 7.4 05/15/2021   ALBUMIN 4.4 05/15/2021   CALCIUM 9.5 05/15/2021   EGFR 79 05/15/2021   GFR 73.79 11/10/2019   No results found for: "CHOL" No results found for: "HDL" No results  found for: "[place]" No results found for: "TRIG" No results found for: "CHOLHDL" No results found for: "HGBA1C"     Assessment & Plan:   Problem List Items Addressed This Visit       Nervous and Auditory   Otitis media follow-up, not resolved - Primary   Relevant Medications   amoxicillin-clavulanate (AUGMENTIN) 875-125 MG tablet Patient has Otitis media and eustation tube dysfunction   Meds ordered this encounter  Medications   amoxicillin-clavulanate (AUGMENTIN) 875-125 MG tablet    Sig: Take 1 tablet by mouth 2 (two) times daily.    Dispense:  20 tablet    Refill:  0   triamcinolone acetonide (KENALOG-40) injection 60 mg       Follow-up: Return if symptoms worsen or fail to improve.  An After Visit Summary was printed and given to the patient.  Reinaldo Meeker, MD [facility] [phone]

## 2022-10-13 ENCOUNTER — Other Ambulatory Visit: Payer: Medicare Other

## 2022-10-13 DIAGNOSIS — E0590 Thyrotoxicosis, unspecified without thyrotoxic crisis or storm: Secondary | ICD-10-CM | POA: Diagnosis not present

## 2022-10-14 LAB — T3, FREE: T3, Free: 5 pg/mL — ABNORMAL HIGH (ref 2.0–4.4)

## 2022-10-14 LAB — T4, FREE: Free T4: 1.61 ng/dL (ref 0.82–1.77)

## 2022-10-14 LAB — TSH: TSH: 0.041 u[IU]/mL — ABNORMAL LOW (ref 0.450–4.500)

## 2022-10-17 ENCOUNTER — Other Ambulatory Visit: Payer: Self-pay | Admitting: Internal Medicine

## 2022-10-17 ENCOUNTER — Telehealth: Payer: Self-pay

## 2022-10-17 MED ORDER — METHIMAZOLE 5 MG PO TABS: 5.0000 mg | ORAL_TABLET | ORAL | 2 refills | Status: DC

## 2022-10-17 NOTE — Telephone Encounter (Signed)
Patient calling about the labs he had drawn at Dr. Henrene Pastor office for his thyroid.

## 2022-10-17 NOTE — Telephone Encounter (Signed)
Left detailed vm for patient to callback with information .

## 2022-10-17 NOTE — Telephone Encounter (Signed)
Patient states that he has been doing 1 tablet Monday- Saturday and half tablet on Sunday.

## 2022-10-17 NOTE — Telephone Encounter (Signed)
Patient notified and verbalized understanding. 

## 2022-11-10 DIAGNOSIS — C4490 Unspecified malignant neoplasm of skin, unspecified: Secondary | ICD-10-CM

## 2022-11-10 HISTORY — DX: Unspecified malignant neoplasm of skin, unspecified: C44.90

## 2022-12-15 NOTE — Progress Notes (Unsigned)
Name: [name] Huff  MRN/ DOB: [id number], [birth year]    Age/ Sex: 77 y.o., male     PCP: Lillard Anes, MD (Inactive)   Reason for Endocrinology Evaluation: Hyperthyroidism     Initial Endocrinology Clinic Visit: 05/16/2019    PATIENT IDENTIFIER: [name] is a 77 y.o., male with a past medical history of HTN. He has followed with [facility] since 05/16/2019 for consultative assistance with management of his Hyperthyroidism      HISTORICAL SUMMARY: The patient was first diagnosed with hypothyroidism many years ago, he was  on LT-4 replacement for many years up to 150 mcg daily,  but in 2019 his dose has been gradually reduced and was off for a few months prior to his presentation to our clinic.    He was diagnosed with hyperthyroidism secondary to graves' disease in 05/2019 and started on Methimazole   Thyroid ultrasound at [facility] in February, 2023 revealed a solitary left thyroid nodule at 2.6 cm, meeting FNA criteria.  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 He has never used smokeless tobacco. He reports that he does not drink alcohol and does not use drugs. Family History:  Family History  Problem Relation Age of Onset   Thyroid disease Sister    Goiter Paternal Grandmother    Heart attack Mother    Colon cancer Father        Patient passed away unknown     HOME MEDICATIONS: Allergies as of 12/16/2022   No Known Allergies      Medication List        Accurate as of December 16, 2022  7:56 AM. If you have any questions, ask your nurse or doctor.          STOP taking these medications    amoxicillin-clavulanate 875-125 MG tablet Commonly known as: AUGMENTIN Stopped by: Dorita Sciara, MD       TAKE these medications    Albuterol Sulfate (sensor) 108 (90 Base) MCG/ACT Aepb Inhale 2 puffs into the lungs in the morning, at noon, in the evening, and at bedtime. Patient uses proair   budesonide-formoterol 80-4.5 MCG/ACT inhaler Commonly known as: SYMBICORT Inhale 2 puffs into the lungs 2 (two) times daily.   fexofenadine 180 MG tablet Commonly known as: ALLEGRA Take 180 mg by mouth daily.   fluticasone 50 MCG/ACT nasal spray Commonly known as: FLONASE  USE TWO SPRAYS IN EACH NOSTRIL DAILY.   lisinopril 20 MG tablet Commonly known as: ZESTRIL TAKE 1 TABLET BY MOUTH EVERY DAY   methimazole 5 MG tablet Commonly known as: TAPAZOLE Take 1 tablet (5 mg total) by mouth as directed. 1 tablet Monday through Saturday , 2 on Sundays   omeprazole 40 MG capsule Commonly known as: PRILOSEC TAKE 1 CAPSULE BY MOUTH EVERY DAY          OBJECTIVE:   PHYSICAL EXAM: VS: BP 122/80 (BP Location: Left Arm, Patient Position: Sitting, Cuff Size: Large)   Pulse 66   Ht '5\' 7"'$  (1.702 m)   Wt 203 lb (92.1 kg)   SpO2 97%   BMI 31.79 kg/m    EXAM: General: Pt appears well and is in NAD   Neck: General: Supple without adenopathy. Thyroid: Unable to appreciate nodules on exam today  Lungs: Clear with good BS bilat   Heart: Auscultation: RRR.  Abdomen: soft, nontender, without masses or organomegaly palpable  Extremities:  BL LE: No pretibial edema normal ROM and strength.  Mental Status: Judgment, insight: Intact Orientation: Oriented to time, place, and person Mood and affect: No depression, anxiety, or agitation     DATA REVIEWED:     Latest Reference Range & Units 12/16/22 08:25  TSH 0.35 - 5.50 uIU/mL 1.66  T4,Free(Direct) 0.60 - 1.60 ng/dL 0.65    Results for [name] Huff, [name] Huff (MRN [id number]) as of 08/16/2019 13:51  Ref. Range 05/16/2019 15:04  TRAB Latest Ref Range: <=2.00 IU/L >40.00 (H)   Thyroid ultrasound 12/30/2021  Nodule #1 Left superior, 2.6 x 1.8 x 1.9 cm, solid almost completely solid, isoechoic ASSESSMENT / PLAN / RECOMMENDATIONS:   Hyperthyroidism Secondary To Graves' Disease:   - Pt with Hashi-Toxi given his prior diagnosis of hypothyroidism  -He is clinically euthyroid -No local neck symptoms -TFTs normal, no change   Medications  Continue methimazole 5 mg, 1 tablet Monday through Saturday (skip Sundays)    2. Graves' Disease:    - No extra-thyroidal manifestations of graves' disease   3.  Left thyroid nodule:  -No local neck symptoms -He is s/p FNA of left thyroid nodule with Hurthle cell lesion (Bethesda category IV), ThyroSeq was benign 12/2021 -We will repeat ultrasound this year -This could be another contributing factor to hyperthyroidism in addition to Graves' disease -Thyroid ultrasound order was printed and provided to the patient to have it done at [facility]    F/U in 4 months     Signed electronically by: Mack Guise, MD  [facility]  [facility] [address] [place], [place] [address] Phone: [phone] FAX: [phone]      CC: Lillard Anes, MD (Inactive) No address on file Phone: None  Fax: None   Return to Endocrinology clinic as below: No future appointments.

## 2022-12-16 ENCOUNTER — Encounter: Payer: Self-pay | Admitting: Internal Medicine

## 2022-12-16 ENCOUNTER — Ambulatory Visit: Payer: Medicare Other | Admitting: Internal Medicine

## 2022-12-16 VITALS — BP 122/80 | HR 66 | Ht 67.0 in | Wt 203.0 lb

## 2022-12-16 DIAGNOSIS — E041 Nontoxic single thyroid nodule: Secondary | ICD-10-CM | POA: Diagnosis not present

## 2022-12-16 DIAGNOSIS — E0590 Thyrotoxicosis, unspecified without thyrotoxic crisis or storm: Secondary | ICD-10-CM | POA: Diagnosis not present

## 2022-12-16 DIAGNOSIS — E0500 Thyrotoxicosis with diffuse goiter without thyrotoxic crisis or storm: Secondary | ICD-10-CM | POA: Diagnosis not present

## 2022-12-16 LAB — TSH: TSH: 1.66 u[IU]/mL (ref 0.35–5.50)

## 2022-12-16 LAB — T4, FREE: Free T4: 0.65 ng/dL (ref 0.60–1.60)

## 2022-12-16 MED ORDER — METHIMAZOLE 5 MG PO TABS: 5.0000 mg | ORAL_TABLET | ORAL | 2 refills | Status: DC

## 2022-12-17 LAB — T3: T3, Total: 136 ng/dL (ref 76–181)

## 2022-12-29 ENCOUNTER — Encounter: Payer: Self-pay | Admitting: Internal Medicine

## 2023-03-11 ENCOUNTER — Telehealth: Payer: Self-pay

## 2023-03-11 NOTE — Telephone Encounter (Signed)
Patient called stating that he wants to see if he can be worked in for  tele health visit he states that his insurance covers phone call visits and he is wanting to be seen for a sinus infection. As of right now there is no openings for the next two days for all providers. Please advise scheduling.

## 2023-03-12 ENCOUNTER — Telehealth (INDEPENDENT_AMBULATORY_CARE_PROVIDER_SITE_OTHER): Payer: Medicare Other | Admitting: Family Medicine

## 2023-03-12 ENCOUNTER — Other Ambulatory Visit: Payer: Self-pay | Admitting: Family Medicine

## 2023-03-12 VITALS — Temp 97.9°F

## 2023-03-12 DIAGNOSIS — J0100 Acute maxillary sinusitis, unspecified: Secondary | ICD-10-CM | POA: Diagnosis not present

## 2023-03-12 MED ORDER — AMOXICILLIN 875 MG PO TABS: 875.0000 mg | ORAL_TABLET | Freq: Two times a day (BID) | ORAL | 0 refills | Status: DC

## 2023-03-12 MED ORDER — FLUTICASONE PROPIONATE 50 MCG/ACT NA SUSP: 2.0000 | Freq: Every day | NASAL | 6 refills | Status: DC

## 2023-03-15 ENCOUNTER — Encounter: Payer: Self-pay | Admitting: Family Medicine

## 2023-03-15 NOTE — Progress Notes (Signed)
Virtual Visit via Telephone Note   This visit type was conducted secondary to patient preference.  This format is felt to be most appropriate for this patient at this time.  All issues noted in this document were discussed and addressed.  No physical exam could be performed with this format.  Patient verbally consented to a telehealth visit.   Date:  03/15/2023   ID:  [name] Huff, DOB 10/17/1946, MRN [id number]  Patient Location: Home Provider Location: Office/Clinic  PCP:  Langley Gauss, PA   History of Present Illness:    Chief Complaint:  URI  [name] Huff is a 77 y.o. male with nasal congestion, sinus pain, and little cough x 1-2 weeks. No fevers. No shortness of breath. Has tried Careers adviser and zyrtec.    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     Past Surgical History:  Procedure Laterality Date   CATARACT EXTRACTION     TONSILECTOMY/ADENOIDECTOMY WITH MYRINGOTOMY  2020    Family History  Problem Relation Age of Onset   Thyroid disease Sister    Goiter Paternal Grandmother    Heart attack Mother    Colon cancer Father        Patient passed away unknown    Social History   Socioeconomic History   Marital status: Legally Separated    Spouse name: Not on file   Number of children: 0   Years of education: Not on file   Highest education level: Not on file  Occupational History   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[employer]   Occupation: Retired  Tobacco Use   Smoking status: Never   Smokeless tobacco: Never  Substance and Sexual Activity   Alcohol use: Never   Drug use: Never   Sexual activity: Yes    Partners: Female  Other Topics Concern   Not on file  Social History Narrative   Not on file   Social Determinants of Health   Financial Resource Strain: Not on  file  Food Insecurity: Not on file  Transportation Needs: Not on file  Physical Activity: Not on file  Stress: Not on file  Social Connections: Not on file  Intimate Partner Violence: Not on file    Outpatient Medications Prior to Visit  Medication Sig Dispense Refill   amoxicillin (AMOXIL) 875 MG tablet Take 1 tablet (875 mg total) by mouth 2 (two) times daily. 20 tablet 0   fexofenadine (ALLEGRA) 180 MG tablet Take 180 mg by mouth daily.     fluticasone (FLONASE) 50 MCG/ACT nasal spray Place 2 sprays into both nostrils daily. 16 g 6   lisinopril (ZESTRIL) 20 MG tablet TAKE 1 TABLET BY MOUTH EVERY DAY 90 tablet 2   methimazole (TAPAZOLE) 5 MG tablet Take 1 tablet (5 mg total) by mouth as directed. 1 tablet Monday through Saturday , 2 on Sundays 96 tablet 2   omeprazole (PRILOSEC) 40 MG capsule TAKE 1 CAPSULE BY MOUTH EVERY DAY 90 capsule 2   No facility-administered medications prior to visit.    Allergies:   Patient has no known allergies.   Social History   Tobacco Use   Smoking status: Never   Smokeless tobacco: Never  Substance Use Topics   Alcohol use: Never   Drug use: Never  Review of Systems  Constitutional:  Negative for chills, fever and malaise/fatigue.  HENT:  Positive for congestion. Negative for ear pain, sinus pain and sore throat.   Respiratory:  Positive for cough. Negative for shortness of breath.   Cardiovascular:  Negative for chest pain.  Musculoskeletal:  Negative for myalgias.  Neurological:  Negative for headaches.     Labs/Other Tests and Data Reviewed:    Recent Labs: 12/16/2022: TSH 1.66   Recent Lipid Panel No results found for: "CHOL", "TRIG", "HDL", "CHOLHDL", "LDLCALC", "LDLDIRECT"  Wt Readings from Last 3 Encounters:  12/16/22 203 lb (92.1 kg)  09/23/22 209 lb (94.8 kg)  06/26/22 202 lb (91.6 kg)     Objective:    Vital Signs:  Temp 97.9 F (36.6 C)    Physical Exam Pulmonary:     Effort: No respiratory distress.       ASSESSMENT & PLAN:    Sinusitis Prescription: amoxicillin 875 mg twice daily x 10 days.  Recommend flonase.    I spent 8 minutes dedicated to the care of this patient on the date of this encounter.  Follow Up:  In Person prn  Signed,  Blane Ohara, MD  03/15/2023 10:22 PM    [facility] [place]

## 2023-03-15 NOTE — Assessment & Plan Note (Signed)
Prescription: amoxicillin 875 mg twice daily x 10 days.  Recommend flonase.

## 2023-03-27 ENCOUNTER — Ambulatory Visit (INDEPENDENT_AMBULATORY_CARE_PROVIDER_SITE_OTHER): Payer: Medicare Other | Admitting: Physician Assistant

## 2023-03-27 ENCOUNTER — Telehealth: Payer: Self-pay

## 2023-03-27 VITALS — BP 124/70 | HR 60 | Temp 97.0°F | Resp 16 | Ht 67.0 in | Wt 203.8 lb

## 2023-03-27 DIAGNOSIS — I10 Essential (primary) hypertension: Secondary | ICD-10-CM

## 2023-03-27 DIAGNOSIS — E782 Mixed hyperlipidemia: Secondary | ICD-10-CM | POA: Diagnosis not present

## 2023-03-27 DIAGNOSIS — E0590 Thyrotoxicosis, unspecified without thyrotoxic crisis or storm: Secondary | ICD-10-CM

## 2023-03-27 DIAGNOSIS — D492 Neoplasm of unspecified behavior of bone, soft tissue, and skin: Secondary | ICD-10-CM | POA: Diagnosis not present

## 2023-03-27 DIAGNOSIS — J3089 Other allergic rhinitis: Secondary | ICD-10-CM

## 2023-03-27 MED ORDER — PREDNISONE 20 MG PO TABS
ORAL_TABLET | ORAL | 0 refills | Status: AC
Start: 2023-03-27 — End: 2023-04-04

## 2023-03-27 NOTE — Assessment & Plan Note (Signed)
Well controlled.  Continue to work on eating a healthy diet and exercise.  Labs drawn today.   No major side effects reported, and no issues with compliance. The current medical regimen is effective;  continue present plan and follow up with Dr.Shamleffer

## 2023-03-27 NOTE — Assessment & Plan Note (Signed)
Continue to work on eating a healthy diet and exercise.  Labs drawn today.   Will add medication if needed depending on labs.

## 2023-03-27 NOTE — Progress Notes (Signed)
Subjective:  Patient ID: [name] Huff, male    DOB: [birth year]  Age: 77 y.o. MRN: [id number]  Chief Complaint  Patient presents with   Medical Management of Chronic Issues    HPI Hyperthyroidism:  Seen by Endocrinology twice yearly.  Methimazole 5 mg Mon-Saturday and 10 mg on Sunday. Patient is currently being followed by endocrinologist Dr. Lonzo Cloud in [place].  We will look at forwarding his thyroid labs to her.  Hypertension:  Lisinopril 20 mg daily.  Patient is able to check it at home and says it doesn't get above 130.  Patient states that he takes lisinopril after lunch but that his blood pressure has been running well at home with systolic around 130 and diastolic getting as low as 66.  Discussed with him to monitor his blood pressure to make sure it does not get too low this now so he can adjust his medication if needed.  GERD:  Omeprazole 40 mg daily.  Patient states he has no complaints or issues with his omeprazole.  Shoulder growth: Patient has a 5 cm x 5 cm growth noted on the left posterior shoulder.  Patient states he is unsure how much has changed within the past few months.  States that he mostly started noticing it because he sleeps on his shoulder and would wake up with blood on his shirt.  Sinusitis: Patient states he has year-round allergies.  States that over the past month he had severe pressure in both his maxillary and frontal sinus.  Went to emergent care and received amoxicillin.  Completed that antibiotic and said he feels much better.  Still has a little bit of fluid in his ears and he is right eardrum had ruptured, but it was in the phases of healing.  Patient states he no longer has any major symptoms of infection.  Discussed with him continue to take his daily Allegra in the morning and then a Benadryl at night depending on severity of symptoms.     03/27/2023    8:15 AM 11/25/2021    3:31 PM 11/06/2020    9:13 AM 06/11/2020    7:42 AM 01/26/2020     3:13 PM  Depression screen PHQ 2/9  Decreased Interest 0 0 0 0 0  Down, Depressed, Hopeless 0 0 0 0 0  PHQ - 2 Score 0 0 0 0 0  Altered sleeping 0      Tired, decreased energy 0      Change in appetite 0      Feeling bad or failure about yourself  0      Trouble concentrating 0      Moving slowly or fidgety/restless 0      Suicidal thoughts 0      PHQ-9 Score 0      Difficult doing work/chores Not difficult at all            03/27/2023    8:15 AM  Fall Risk   Falls in the past year? 1  Number falls in past yr: 1  Injury with Fall? 0  Risk for fall due to : No Fall Risks  Follow up Falls evaluation completed;Falls prevention discussed    Patient Care Team: Langley Gauss, [place] as PCP - General (Physician Assistant) Shamleffer, Konrad Dolores, MD as Attending Physician (Endocrinology)   Review of Systems  Constitutional:  Negative for chills and fever.  HENT:  Positive for congestion and postnasal drip. Negative for rhinorrhea and sore throat.   Respiratory:  Negative for cough and shortness of breath.   Cardiovascular:  Negative for chest pain and palpitations.  Gastrointestinal:  Negative for abdominal pain, constipation, diarrhea, nausea and vomiting.  Genitourinary:  Negative for dysuria and urgency.  Musculoskeletal:  Positive for back pain. Negative for arthralgias and myalgias.  Skin:        Lesion left shoulder.   Neurological:  Negative for dizziness and headaches.  Psychiatric/Behavioral:  Negative for dysphoric mood. The patient is not nervous/anxious.     Current Outpatient Medications on File Prior to Visit  Medication Sig Dispense Refill   fexofenadine (ALLEGRA) 180 MG tablet Take 180 mg by mouth daily.     fluticasone (FLONASE) 50 MCG/ACT nasal spray Place 2 sprays into both nostrils daily. 16 g 6   methimazole (TAPAZOLE) 5 MG tablet Take 1 tablet (5 mg total) by mouth as directed. 1 tablet Monday through Saturday , 2 on Sundays 96 tablet 2   omeprazole  (PRILOSEC) 40 MG capsule TAKE 1 CAPSULE BY MOUTH EVERY DAY 90 capsule 2   No current facility-administered medications on file prior to visit.   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    Past Surgical History:  Procedure Laterality Date   CATARACT EXTRACTION     TONSILECTOMY/ADENOIDECTOMY WITH MYRINGOTOMY  2020    Family History  Problem Relation Age of Onset   Thyroid disease Sister    Goiter Paternal Grandmother    Heart attack Mother    Colon cancer Father        Patient passed away unknown   Social History   Socioeconomic History   Marital status: Legally Separated    Spouse name: Not on file   Number of children: 0   Years of education: Not on file   Highest education level: Not on file  Occupational History   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[employer]   Occupation: Retired  Tobacco Use   Smoking status: Never   Smokeless tobacco: Never  Substance and Sexual Activity   Alcohol use: Never   Drug use: Never   Sexual activity: Yes    Partners: Female  Other Topics Concern   Not on file  Social History Narrative   Not on file   Social Determinants of Health   Financial Resource Strain: Not on file  Food Insecurity: Not on file  Transportation Needs: Not on file  Physical Activity: Not on file  Stress: Not on file  Social Connections: Not on file    Objective:  BP 124/70   Pulse 60   Temp (!) 97 F (36.1 C)   Resp 16   Ht 5\' 7"  (1.702 m)   Wt 203 lb 12.8 oz (92.4 kg)   BMI 31.92 kg/m      03/27/2023    8:11 AM 12/16/2022    7:42 AM 09/23/2022    2:39 PM  BP/Weight  Systolic BP 124 122 130  Diastolic BP 70 80 72  Wt. (Lbs) 203.8 203 209  BMI 31.92 kg/m2 31.79 kg/m2 32.73 kg/m2    Physical Exam Constitutional:      Appearance: Normal appearance.  HENT:      Right Ear: Ear canal normal. Tympanic membrane is perforated.     Left Ear: Tympanic membrane and ear canal normal.  Cardiovascular:  Rate and Rhythm: Normal rate and regular rhythm.     Heart sounds: Normal heart sounds.  Pulmonary:     Effort: Pulmonary effort is normal.     Breath sounds: Normal breath sounds.  Abdominal:     General: Bowel sounds are normal.     Palpations: Abdomen is soft.  Skin:    General: Skin is warm.     Findings: Erythema and lesion present.          Comments: 5cmX5cm abnormal growth with irregular margins and raised boarders, erythematous, non fluctuant, solid, mobile.   Neurological:     Mental Status: He is alert.  Psychiatric:        Behavior: Behavior normal.     Diabetic Foot Exam - Simple   No data filed      Lab Results  Component Value Date   WBC 8.7 03/27/2023   HGB 17.0 03/27/2023   HCT 50.6 03/27/2023   PLT 287 03/27/2023   GLUCOSE 91 03/27/2023   CHOL 178 03/27/2023   TRIG 133 03/27/2023   HDL 45 03/27/2023   LDLCALC 109 (H) 03/27/2023   ALT 16 03/27/2023   AST 17 03/27/2023   NA 138 03/27/2023   K 4.5 03/27/2023   CL 101 03/27/2023   CREATININE 1.05 03/27/2023   BUN 9 03/27/2023   CO2 21 03/27/2023   TSH 0.684 03/27/2023      Assessment & Plan:    Primary hypertension Assessment & Plan: Well controlled.  Continue to work on eating a healthy diet and exercise.  Labs drawn today.   No major side effects reported, and no issues with compliance. The current medical regimen is effective;  continue present plan with Lisinopril 20mg   Orders: -     Comprehensive metabolic panel -     CBC with Differential/Platelet  Hyperthyroidism Assessment & Plan: Well controlled.  Continue to work on eating a healthy diet and exercise.  Labs drawn today.   No major side effects reported, and no issues with compliance. The current medical regimen is effective;  continue present plan and follow up with  Dr.Shamleffer   Orders: -     T4, free -     TSH  Mixed hyperlipidemia Assessment & Plan: Continue to work on eating a healthy diet and exercise.  Labs drawn today.   Will add medication if needed depending on labs.  Orders: -     Lipid panel  Abnormal skin growth -     Ambulatory referral to Dermatology  Environmental and seasonal allergies Assessment & Plan: Patient states the Flonase and Allegra 180mg  generally help with the symptoms. Discussed with him taking a benadryl at night if symptoms are severe. Discussed only using the allegra or Flonase in the morning. Patient states he did recenlty finish antibiotics for a sinus infection. Stated he still felt a little inflammation and fluid in his sinus. Prescribed prednisone to help get him through the tail end of the infection.  Orders: -     predniSONE; Take 3 tablets (60 mg total) by mouth daily with breakfast for 3 days, THEN 2 tablets (40 mg total) daily with breakfast for 3 days, THEN 1 tablet (20 mg total) daily with breakfast for 3 days.  Dispense: 18 tablet; Refill: 0  Other orders -     Cardiovascular Risk Assessment     Meds ordered this encounter  Medications   predniSONE (DELTASONE) 20 MG tablet    Sig: Take 3 tablets (60 mg total)  by mouth daily with breakfast for 3 days, THEN 2 tablets (40 mg total) daily with breakfast for 3 days, THEN 1 tablet (20 mg total) daily with breakfast for 3 days.    Dispense:  18 tablet    Refill:  0    Orders Placed This Encounter  Procedures   Comprehensive metabolic panel   CBC with Differential/Platelet   Lipid panel   T4, free   TSH   Cardiovascular Risk Assessment   Ambulatory referral to Dermatology     Follow-up: Return in about 3 months (around 06/27/2023) for [facility], fasting.   I,Carolyn M Morrison,acting as a Neurosurgeon for [employer], PA.,have documented all relevant documentation on the behalf of Langley Gauss, PA,as directed by  Langley Gauss, PA while in the  presence of Langley Gauss, [place].   An After Visit Summary was printed and given to the patient.  Langley Gauss, [place] [facility] [phone]

## 2023-03-27 NOTE — Telephone Encounter (Signed)
Called patient to make him aware that Dr. Sedalia Muta got him a appointment at [facility] dermatology on 03/31/2023 at 10:20 AM and they want him to bring proof of insurance and bring all medications he is taking. Left detail message for patient

## 2023-03-27 NOTE — Assessment & Plan Note (Addendum)
Patient states the Flonase and Allegra 180mg  generally help with the symptoms. Discussed with him taking a benadryl at night if symptoms are severe. Discussed only using the allegra or Flonase in the morning. Patient states he did recenlty finish antibiotics for a sinus infection. Stated he still felt a little inflammation and fluid in his sinus. Prescribed prednisone to help get him through the tail end of the infection.

## 2023-03-27 NOTE — Assessment & Plan Note (Signed)
Well controlled.  Continue to work on eating a healthy diet and exercise.  Labs drawn today.   No major side effects reported, and no issues with compliance. The current medical regimen is effective;  continue present plan with Lisinopril 20mg 

## 2023-03-28 LAB — CBC WITH DIFFERENTIAL/PLATELET
Basophils Absolute: 0.1 10*3/uL (ref 0.0–0.2)
Basos: 1 %
EOS (ABSOLUTE): 0.5 10*3/uL — ABNORMAL HIGH (ref 0.0–0.4)
Eos: 6 %
Hematocrit: 50.6 % (ref 37.5–51.0)
Hemoglobin: 17 g/dL (ref 13.0–17.7)
Immature Grans (Abs): 0 10*3/uL (ref 0.0–0.1)
Immature Granulocytes: 1 %
Lymphocytes Absolute: 2.3 10*3/uL (ref 0.7–3.1)
Lymphs: 26 %
MCH: 27 pg (ref 26.6–33.0)
MCHC: 33.6 g/dL (ref 31.5–35.7)
MCV: 80 fL (ref 79–97)
Monocytes Absolute: 0.7 10*3/uL (ref 0.1–0.9)
Monocytes: 8 %
Neutrophils Absolute: 5.2 10*3/uL (ref 1.4–7.0)
Neutrophils: 58 %
Platelets: 287 10*3/uL (ref 150–450)
RBC: 6.29 x10E6/uL — ABNORMAL HIGH (ref 4.14–5.80)
RDW: 13.2 % (ref 11.6–15.4)
WBC: 8.7 10*3/uL (ref 3.4–10.8)

## 2023-03-28 LAB — COMPREHENSIVE METABOLIC PANEL
ALT: 16 IU/L (ref 0–44)
AST: 17 IU/L (ref 0–40)
Albumin/Globulin Ratio: 1.5 (ref 1.2–2.2)
Albumin: 4.2 g/dL (ref 3.8–4.8)
Alkaline Phosphatase: 102 IU/L (ref 44–121)
BUN/Creatinine Ratio: 9 — ABNORMAL LOW (ref 10–24)
BUN: 9 mg/dL (ref 8–27)
Bilirubin Total: 0.5 mg/dL (ref 0.0–1.2)
CO2: 21 mmol/L (ref 20–29)
Calcium: 9.5 mg/dL (ref 8.6–10.2)
Chloride: 101 mmol/L (ref 96–106)
Creatinine, Ser: 1.05 mg/dL (ref 0.76–1.27)
Globulin, Total: 2.8 g/dL (ref 1.5–4.5)
Glucose: 91 mg/dL (ref 70–99)
Potassium: 4.5 mmol/L (ref 3.5–5.2)
Sodium: 138 mmol/L (ref 134–144)
Total Protein: 7 g/dL (ref 6.0–8.5)
eGFR: 73 mL/min/{1.73_m2} (ref >59)

## 2023-03-28 LAB — LIPID PANEL
Chol/HDL Ratio: 4 ratio (ref 0.0–5.0)
Cholesterol, Total: 178 mg/dL (ref 100–199)
HDL: 45 mg/dL (ref >39)
LDL Chol Calc (NIH): 109 mg/dL — ABNORMAL HIGH (ref 0–99)
Triglycerides: 133 mg/dL (ref 0–149)
VLDL Cholesterol Cal: 24 mg/dL (ref 5–40)

## 2023-03-28 LAB — TSH: TSH: 0.684 u[IU]/mL (ref 0.450–4.500)

## 2023-03-28 LAB — T4, FREE: Free T4: 1.04 ng/dL (ref 0.82–1.77)

## 2023-04-09 ENCOUNTER — Other Ambulatory Visit: Payer: Self-pay

## 2023-04-09 MED ORDER — LISINOPRIL 20 MG PO TABS: 20.0000 mg | ORAL_TABLET | Freq: Every day | ORAL | 2 refills | Status: DC

## 2023-04-10 ENCOUNTER — Encounter: Payer: Self-pay | Admitting: Physician Assistant

## 2023-04-13 ENCOUNTER — Other Ambulatory Visit: Payer: Self-pay

## 2023-04-13 ENCOUNTER — Telehealth: Payer: Self-pay | Admitting: Physician Assistant

## 2023-04-13 MED ORDER — OMEPRAZOLE 40 MG PO CPDR: DELAYED_RELEASE_CAPSULE | ORAL | 1 refills | Status: DC

## 2023-04-13 NOTE — Telephone Encounter (Signed)
Prescription Request  04/13/2023  LOV: 03/27/2023  What is the name of the medication or equipment? omeprazole   Have you contacted your pharmacy to request a refill? Yes   Which pharmacy would you like this sent to?  [address] - [place], [place] - [address] [place] [address] Phone: [phone] Fax: [phone]    Patient notified that their request is being sent to the clinical staff for review and that they should receive a response within 2 business days.   Please advise at [facility] [phone]

## 2023-04-13 NOTE — Telephone Encounter (Signed)
done

## 2023-04-16 ENCOUNTER — Telehealth: Payer: Self-pay

## 2023-04-16 NOTE — Telephone Encounter (Signed)
Pt contacted and advised Thyroid function tests are within normal range, continue current dose of methimazole

## 2023-04-16 NOTE — Telephone Encounter (Signed)
Lvm for pt to call back to discuss lab results.

## 2023-04-16 NOTE — Telephone Encounter (Signed)
Patient calling  about labs result that he did on 03/27/23 for thyroid.

## 2023-04-21 ENCOUNTER — Telehealth: Payer: Self-pay | Admitting: Internal Medicine

## 2023-04-21 NOTE — Telephone Encounter (Signed)
patient canceled appointment for tomorrow states he has cancer surgery this morning. wanted Dr. Lonzo Cloud to be advised or the surgery.

## 2023-04-22 ENCOUNTER — Ambulatory Visit: Payer: Medicare Other | Admitting: Internal Medicine

## 2023-06-24 ENCOUNTER — Telehealth: Payer: Self-pay | Admitting: Physician Assistant

## 2023-06-24 NOTE — Telephone Encounter (Signed)
Pt called in asking if his [facility] telephone call could be around 11:30 that 8am is to early for him. Ty.

## 2023-06-25 ENCOUNTER — Ambulatory Visit: Payer: Medicare Other

## 2023-06-25 VITALS — Ht 67.0 in | Wt 203.0 lb

## 2023-06-25 DIAGNOSIS — Z0000 Encounter for general adult medical examination without abnormal findings: Secondary | ICD-10-CM

## 2023-06-25 NOTE — Progress Notes (Signed)
Subjective:   [name] Huff is a 77 y.o. male who presents for Medicare Annual/Subsequent preventive examination.  Visit Complete: Virtual  I connected with  [name] on 06/25/23 by a audio enabled telemedicine application and verified that I am speaking with the correct person using two identifiers.  Patient Location: Home  Provider Location: Home Office  I discussed the limitations of evaluation and management by telemedicine. The patient expressed understanding and agreed to proceed.    Review of Systems    Vital Signs: Unable to obtain new vitals due to this being a telehealth visit.  Cardiac Risk Factors include: advanced age (>96men, >68 women);hypertension;male gender     Objective:    Today's Vitals   06/25/23 1114  Weight: 203 lb (92.1 kg)  Height: 5\' 7"  (1.702 m)   Body mass index is 31.79 kg/m.     06/25/2023   11:23 AM 11/06/2020    9:13 AM  Advanced Directives  Does Patient Have a Medical Advance Directive? No No  Would patient like information on creating a medical advance directive? No - Patient declined     Current Medications (verified) Outpatient Encounter Medications as of 06/25/2023  Medication Sig   fexofenadine (ALLEGRA) 180 MG tablet Take 180 mg by mouth daily.   fluticasone (FLONASE) 50 MCG/ACT nasal spray Place 2 sprays into both nostrils daily.   lisinopril (ZESTRIL) 20 MG tablet Take 1 tablet (20 mg total) by mouth daily.   methimazole (TAPAZOLE) 5 MG tablet Take 1 tablet (5 mg total) by mouth as directed. 1 tablet Monday through Saturday , 2 on Sundays   omeprazole (PRILOSEC) 40 MG capsule TAKE 1 CAPSULE BY MOUTH EVERY DAY   No facility-administered encounter medications on file as of 06/25/2023.    Allergies (verified) Patient has no known allergies.   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    Past Surgical History:  Procedure Laterality Date   CATARACT EXTRACTION     TONSILECTOMY/ADENOIDECTOMY WITH MYRINGOTOMY  2020   Family History  Problem Relation Age of Onset   Thyroid disease Sister    Goiter Paternal Grandmother    Heart attack Mother    Colon cancer Father        Patient passed away unknown   Social History   Socioeconomic History   Marital status: Legally Separated    Spouse name: Not on file   Number of children: 0   Years of education: Not on file   Highest education level: Not on file  Occupational History   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[employer]   Occupation: Retired  Tobacco Use   Smoking status: Never   Smokeless tobacco: Never  Substance and Sexual Activity   Alcohol use: Never   Drug use: Never   Sexual activity: Yes    Partners: Female  Other Topics Concern   Not on file  Social History Narrative   Not on file   Social Determinants of Health   Financial Resource Strain: Low Risk  (06/25/2023)   Overall Financial Resource Strain (CARDIA)    Difficulty of Paying Living Expenses: Not hard at all  Food Insecurity: No Food Insecurity (06/25/2023)   Hunger Vital Sign    Worried About Running Out of Food in the Last Year: Never true  Ran Out of Food in the Last Year: Never true  Transportation Needs: No Transportation Needs (06/25/2023)   PRAPARE - Administrator, Civil Service (Medical): No    Lack of Transportation (Non-Medical): No  Physical Activity: Inactive (06/25/2023)   Exercise Vital Sign    Days of Exercise per Week: 0 days    Minutes of Exercise per Session: 0 min  Stress: No Stress Concern Present (06/25/2023)   [employer] of Occupational Health - Occupational Stress Questionnaire    Feeling of Stress : Not at all  Social Connections: Socially Isolated (06/25/2023)   Social Connection and Isolation Panel [NHANES]    Frequency of  Communication with Friends and Family: More than three times a week    Frequency of Social Gatherings with Friends and Family: More than three times a week    Attends Religious Services: Never    Database administrator or Organizations: No    Attends Engineer, structural: Never    Marital Status: Divorced    Tobacco Counseling Counseling given: Not Answered   Clinical Intake:  Pre-visit preparation completed: Yes  Pain : No/denies pain     BMI - recorded: 31.79 Nutritional Status: BMI > 30  Obese Nutritional Risks: None Diabetes: No  How often do you need to have someone help you when you read instructions, pamphlets, or other written materials from your doctor or pharmacy?: 1 - Never  Interpreter Needed?: No  Information entered by :: Theresa Mulligan LPN   Activities of Daily Living    06/25/2023   11:22 AM  In your present state of health, do you have any difficulty performing the following activities:  Hearing? 0  Vision? 0  Difficulty concentrating or making decisions? 0  Walking or climbing stairs? 0  Dressing or bathing? 0  Doing errands, shopping? 0  Preparing Food and eating ? N  Using the Toilet? N  In the past six months, have you accidently leaked urine? N  Do you have problems with loss of bowel control? N  Managing your Medications? N  Managing your Finances? N  Housekeeping or managing your Housekeeping? N    Patient Care Team: Langley Gauss, [place] as PCP - General (Physician Assistant) Lonzo Cloud, Konrad Dolores, MD as Attending Physician (Endocrinology)  Indicate any recent Medical Services you may have received from other than [facility] providers in the past year (date may be approximate).     Assessment:   This is a routine wellness examination for [name] Huff.  Hearing/Vision screen Hearing Screening - Comments:: Denies hearing difficulties   Vision Screening - Comments:: Wears reading glasses - up to date with routine eye exams with  [facility]  Dietary issues and exercise activities discussed:     Goals Addressed               This Visit's Progress     Stay Healthy (pt-stated)         Depression Screen    06/25/2023   11:21 AM 03/27/2023    8:15 AM 11/25/2021    3:31 PM 11/06/2020    9:13 AM 06/11/2020    7:42 AM 01/26/2020    3:13 PM  PHQ 2/9 Scores  PHQ - 2 Score 0 0 0 0 0 0  PHQ- 9 Score  0        Fall Risk    06/25/2023   11:22 AM 03/27/2023    8:15 AM 11/25/2021    3:31 PM  11/06/2020    9:13 AM 06/11/2020    7:44 AM  Fall Risk   Falls in the past year? 0 1 0 0 0  Number falls in past yr: 0 1 0 0 0  Injury with Fall? 0 0 0 0 0  Risk for fall due to : No Fall Risks No Fall Risks No Fall Risks No Fall Risks   Follow up Falls prevention discussed Falls evaluation completed;Falls prevention discussed Education provided  Falls evaluation completed    MEDICARE RISK AT HOME:  Medicare Risk at Home - 06/25/23 1127     Any stairs in or around the home? No    If so, are there any without handrails? No    Home free of loose throw rugs in walkways, pet beds, electrical cords, etc? Yes    Adequate lighting in your home to reduce risk of falls? Yes    Life alert? No    Use of a cane, walker or w/c? No    Grab bars in the bathroom? No    Shower chair or bench in shower? No    Elevated toilet seat or a handicapped toilet? Yes             TIMED UP AND GO:  Was the test performed?  No    Cognitive Function:        06/25/2023   11:23 AM 11/06/2020    9:15 AM  6CIT Screen  What Year? 0 points 0 points  What month? 0 points 0 points  What time? 0 points 0 points  Count back from 20 0 points 0 points  Months in reverse 0 points 0 points  Repeat phrase 0 points 6 points  Total Score 0 points 6 points    Immunizations Immunization History  Administered Date(s) Administered   Fluad Quad(high Dose 65+) 09/07/2020, 09/02/2021, 09/01/2022   Pneumococcal Polysaccharide-23 12/24/2011     TDAP status: Due, Education has been provided regarding the importance of this vaccine. Advised may receive this vaccine at local pharmacy or Health Dept. Aware to provide a copy of the vaccination record if obtained from local pharmacy or Health Dept. Verbalized acceptance and understanding.  Flu Vaccine status: Due, Education has been provided regarding the importance of this vaccine. Advised may receive this vaccine at local pharmacy or Health Dept. Aware to provide a copy of the vaccination record if obtained from local pharmacy or Health Dept. Verbalized acceptance and understanding.  Pneumococcal vaccine status: Due, Education has been provided regarding the importance of this vaccine. Advised may receive this vaccine at local pharmacy or Health Dept. Aware to provide a copy of the vaccination record if obtained from local pharmacy or Health Dept. Verbalized acceptance and understanding.  Covid-19 vaccine status: Declined, Education has been provided regarding the importance of this vaccine but patient still declined. Advised may receive this vaccine at local pharmacy or Health Dept.or vaccine clinic. Aware to provide a copy of the vaccination record if obtained from local pharmacy or Health Dept. Verbalized acceptance and understanding.  Qualifies for Shingles Vaccine? Yes   Zostavax completed No   Shingrix Completed?: No.    Education has been provided regarding the importance of this vaccine. Patient has been advised to call insurance company to determine out of pocket expense if they have not yet received this vaccine. Advised may also receive vaccine at local pharmacy or Health Dept. Verbalized acceptance and understanding.  Screening Tests Health Maintenance  Topic Date Due   Hepatitis C  Screening  Never done   DTaP/Tdap/Td (1 - Tdap) Never done   Zoster Vaccines- Shingrix (1 of 2) Never done   Pneumonia Vaccine 1+ Years old (2 of 2 - PCV) 12/23/2012   COVID-19 Vaccine (1 -  2023-24 season) Never done   INFLUENZA VACCINE  06/11/2023   Medicare Annual Wellness (AWV)  06/24/2024   HPV VACCINES  Aged Out    Health Maintenance  Health Maintenance Due  Topic Date Due   Hepatitis C Screening  Never done   DTaP/Tdap/Td (1 - Tdap) Never done   Zoster Vaccines- Shingrix (1 of 2) Never done   Pneumonia Vaccine 73+ Years old (2 of 2 - PCV) 12/23/2012   COVID-19 Vaccine (1 - 2023-24 season) Never done   INFLUENZA VACCINE  06/11/2023      Lung Cancer Screening: (Low Dose CT Chest recommended if Age 51-80 years, 20 pack-year currently smoking OR have quit w/in 15years.) does not qualify.     Additional Screening:  Hepatitis C Screening: does qualify; Completed Deferred  Vision Screening: Recommended annual ophthalmology exams for early detection of glaucoma and other disorders of the eye. Is the patient up to date with their annual eye exam?  Yes  Who is the provider or what is the name of the office in which the patient attends annual eye exams? [facility] If pt is not established with a provider, would they like to be referred to a provider to establish care? No .   Dental Screening: Recommended annual dental exams for proper oral hygiene    Community Resource Referral / Chronic Care Management:  CRR required this visit?  No   CCM required this visit?  No     Plan:     I have personally reviewed and noted the following in the patient's chart:   Medical and social history Use of alcohol, tobacco or illicit drugs  Current medications and supplements including opioid prescriptions. Patient is not currently taking opioid prescriptions. Functional ability and status Nutritional status Physical activity Advanced directives List of other physicians Hospitalizations, surgeries, and ER visits in previous 12 months Vitals Screenings to include cognitive, depression, and falls Referrals and appointments  In addition, I have reviewed and  discussed with patient certain preventive protocols, quality metrics, and best practice recommendations. A written personalized care plan for preventive services as well as general preventive health recommendations were provided to patient.     Tillie Rung, LPN   [id number]   After Visit Summary: (MyChart) Due to this being a telephonic visit, the after visit summary with patients personalized plan was offered to patient via MyChart   Nurse Notes: None

## 2023-06-25 NOTE — Patient Instructions (Addendum)
[name] Huff , Thank you for taking time to come for your Medicare Wellness Visit. I appreciate your ongoing commitment to your health goals. Please review the following plan we discussed and let me know if I can assist you in the future.   Referrals/Orders/Follow-Ups/Clinician Recommendations:   This is a list of the screening recommended for you and due dates:  Health Maintenance  Topic Date Due   Hepatitis C Screening  Never done   DTaP/Tdap/Td vaccine (1 - Tdap) Never done   Zoster (Shingles) Vaccine (1 of 2) Never done   Pneumonia Vaccine (2 of 2 - PCV) 12/23/2012   COVID-19 Vaccine (1 - 2023-24 season) Never done   Flu Shot  06/11/2023   Medicare Annual Wellness Visit  06/24/2024   HPV Vaccine  Aged Out    Advanced directives: (Declined) Advance directive discussed with you today. Even though you declined this today, please call our office should you change your mind, and we can give you the proper paperwork for you to fill out.  Next Medicare Annual Wellness Visit scheduled for next year: Yes  Preventive Care 77 Years and Older, Male  Preventive care refers to lifestyle choices and visits with your health care provider that can promote health and wellness. What does preventive care include? A yearly physical exam. This is also called an annual well check. Dental exams once or twice a year. Routine eye exams. Ask your health care provider how often you should have your eyes checked. Personal lifestyle choices, including: Daily care of your teeth and gums. Regular physical activity. Eating a healthy diet. Avoiding tobacco and drug use. Limiting alcohol use. Practicing safe sex. Taking low doses of aspirin every day. Taking vitamin and mineral supplements as recommended by your health care provider. What happens during an annual well check? The services and screenings done by your health care provider during your annual well check will depend on your age, overall health,  lifestyle risk factors, and family history of disease. Counseling  Your health care provider may ask you questions about your: Alcohol use. Tobacco use. Drug use. Emotional well-being. Home and relationship well-being. Sexual activity. Eating habits. History of falls. Memory and ability to understand (cognition). Work and work Astronomer. Screening  You may have the following tests or measurements: Height, weight, and BMI. Blood pressure. Lipid and cholesterol levels. These may be checked every 5 years, or more frequently if you are over 39 years old. Skin check. Lung cancer screening. You may have this screening every year starting at age 26 if you have a 30-pack-year history of smoking and currently smoke or have quit within the past 15 years. Fecal occult blood test (FOBT) of the stool. You may have this test every year starting at age 62. Flexible sigmoidoscopy or colonoscopy. You may have a sigmoidoscopy every 5 years or a colonoscopy every 10 years starting at age 26. Prostate cancer screening. Recommendations will vary depending on your family history and other risks. Hepatitis C blood test. Hepatitis B blood test. Sexually transmitted disease (STD) testing. Diabetes screening. This is done by checking your blood sugar (glucose) after you have not eaten for a while (fasting). You may have this done every 1-3 years. Abdominal aortic aneurysm (AAA) screening. You may need this if you are a current or former smoker. Osteoporosis. You may be screened starting at age 56 if you are at high risk. Talk with your health care provider about your test results, treatment options, and if necessary, the need  for more tests. Vaccines  Your health care provider may recommend certain vaccines, such as: Influenza vaccine. This is recommended every year. Tetanus, diphtheria, and acellular pertussis (Tdap, Td) vaccine. You may need a Td booster every 10 years. Zoster vaccine. You may need this  after age 85. Pneumococcal 13-valent conjugate (PCV13) vaccine. One dose is recommended after age 86. Pneumococcal polysaccharide (PPSV23) vaccine. One dose is recommended after age 33. Talk to your health care provider about which screenings and vaccines you need and how often you need them. This information is not intended to replace advice given to you by your health care provider. Make sure you discuss any questions you have with your health care provider. Document Released: 11/23/2015 Document Revised: 07/16/2016 Document Reviewed: 08/28/2015 Elsevier Interactive Patient Education  2017 [employer].  Fall Prevention in the Home Falls can cause injuries. They can happen to people of all ages. There are many things you can do to make your home safe and to help prevent falls. What can I do on the outside of my home? Regularly fix the edges of walkways and driveways and fix any cracks. Remove anything that might make you trip as you walk through a door, such as a raised step or threshold. Trim any bushes or trees on the path to your home. Use bright outdoor lighting. Clear any walking paths of anything that might make someone trip, such as rocks or tools. Regularly check to see if handrails are loose or broken. Make sure that both sides of any steps have handrails. Any raised decks and porches should have guardrails on the edges. Have any leaves, snow, or ice cleared regularly. Use sand or salt on walking paths during winter. Clean up any spills in your garage right away. This includes oil or grease spills. What can I do in the bathroom? Use night lights. Install grab bars by the toilet and in the tub and shower. Do not use towel bars as grab bars. Use non-skid mats or decals in the tub or shower. If you need to sit down in the shower, use a plastic, non-slip stool. Keep the floor dry. Clean up any water that spills on the floor as soon as it happens. Remove soap buildup in the tub or  shower regularly. Attach bath mats securely with double-sided non-slip rug tape. Do not have throw rugs and other things on the floor that can make you trip. What can I do in the bedroom? Use night lights. Make sure that you have a light by your bed that is easy to reach. Do not use any sheets or blankets that are too big for your bed. They should not hang down onto the floor. Have a firm chair that has side arms. You can use this for support while you get dressed. Do not have throw rugs and other things on the floor that can make you trip. What can I do in the kitchen? Clean up any spills right away. Avoid walking on wet floors. Keep items that you use a lot in easy-to-reach places. If you need to reach something above you, use a strong step stool that has a grab bar. Keep electrical cords out of the way. Do not use floor polish or wax that makes floors slippery. If you must use wax, use non-skid floor wax. Do not have throw rugs and other things on the floor that can make you trip. What can I do with my stairs? Do not leave any items on the stairs.  Make sure that there are handrails on both sides of the stairs and use them. Fix handrails that are broken or loose. Make sure that handrails are as long as the stairways. Check any carpeting to make sure that it is firmly attached to the stairs. Fix any carpet that is loose or worn. Avoid having throw rugs at the top or bottom of the stairs. If you do have throw rugs, attach them to the floor with carpet tape. Make sure that you have a light switch at the top of the stairs and the bottom of the stairs. If you do not have them, ask someone to add them for you. What else can I do to help prevent falls? Wear shoes that: Do not have high heels. Have rubber bottoms. Are comfortable and fit you well. Are closed at the toe. Do not wear sandals. If you use a stepladder: Make sure that it is fully opened. Do not climb a closed stepladder. Make  sure that both sides of the stepladder are locked into place. Ask someone to hold it for you, if possible. Clearly mark and make sure that you can see: Any grab bars or handrails. First and last steps. Where the edge of each step is. Use tools that help you move around (mobility aids) if they are needed. These include: Canes. Walkers. Scooters. Crutches. Turn on the lights when you go into a dark area. Replace any light bulbs as soon as they burn out. Set up your furniture so you have a clear path. Avoid moving your furniture around. If any of your floors are uneven, fix them. If there are any pets around you, be aware of where they are. Review your medicines with your doctor. Some medicines can make you feel dizzy. This can increase your chance of falling. Ask your doctor what other things that you can do to help prevent falls. This information is not intended to replace advice given to you by your health care provider. Make sure you discuss any questions you have with your health care provider. Document Released: 08/23/2009 Document Revised: 04/03/2016 Document Reviewed: 12/01/2014 Elsevier Interactive Patient Education  2017 [employer].

## 2023-07-09 ENCOUNTER — Ambulatory Visit (INDEPENDENT_AMBULATORY_CARE_PROVIDER_SITE_OTHER): Payer: Medicare Other

## 2023-07-09 VITALS — BP 108/60 | HR 78 | Temp 97.1°F | Resp 16 | Ht 67.0 in | Wt 201.0 lb

## 2023-07-09 DIAGNOSIS — J3489 Other specified disorders of nose and nasal sinuses: Secondary | ICD-10-CM | POA: Diagnosis not present

## 2023-07-09 DIAGNOSIS — J4521 Mild intermittent asthma with (acute) exacerbation: Secondary | ICD-10-CM | POA: Diagnosis not present

## 2023-07-09 DIAGNOSIS — J0100 Acute maxillary sinusitis, unspecified: Secondary | ICD-10-CM | POA: Diagnosis not present

## 2023-07-09 LAB — POC COVID19 BINAXNOW

## 2023-07-09 MED ORDER — AMOXICILLIN 500 MG PO CAPS: 500.0000 mg | ORAL_CAPSULE | Freq: Three times a day (TID) | ORAL | 0 refills | Status: AC

## 2023-07-09 MED ORDER — PREDNISONE 20 MG PO TABS: 40.0000 mg | ORAL_TABLET | Freq: Every day | ORAL | 0 refills | Status: AC

## 2023-07-09 MED ORDER — FLUTICASONE PROPIONATE 50 MCG/ACT NA SUSP: 2.0000 | Freq: Every day | NASAL | 6 refills | Status: DC

## 2023-07-09 NOTE — Assessment & Plan Note (Signed)
[name] Huff is here with symptoms of sinus pressure, headache, facial pain, postnasal drainage, productive cough, rhinorrhea for couple weeks but symptoms reportedly have been worse since yesterday  On exam, he has mild maxillary sinus tenderness bilaterally, erythematous nasal mucosa, thick postnasal drainage and slightly bulging tympanic membranes bilaterally with no signs of infection.  Plan: Could be the beginning of maxillary sinusitis or could be a flareup of his allergies -Sent a prescription for prednisone 20 mg to take 2 tablets daily for 5 to 7 days -Refill his Flonase to use after the prednisone course -Recommended plenty of fluids, steam inhalation etc. -If no improvement in symptoms towards the end of prednisone course, he could then pick up and take the antibiotic amoxicillin which I prescribed today.  Follow-up with PCP as needed

## 2023-07-09 NOTE — Assessment & Plan Note (Signed)
Not on any inhalers at this time.  Uses allergy medications.  Does not feel the need for inhalers. He could call back if he does.

## 2023-07-09 NOTE — Progress Notes (Signed)
Acute Office Visit  Subjective:    Patient ID: [name] Huff, male    DOB: [birth year], 77 y.o.   MRN: [id number]  Chief Complaint  Patient presents with   Sinus infection    HPI: Patient is in today for sinus pain and pressure.  He reports that he awakened with sinus drainage and nausea this morning.  He denies fever or chills but he does report having fatigue.    States his symptoms have been off and off for couple weeks but worse for the past days Reports post nasal drainage and some productive cough No fever/ no headaches but has some facial pain.  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     Past Surgical History:  Procedure Laterality Date   CATARACT EXTRACTION     TONSILECTOMY/ADENOIDECTOMY WITH MYRINGOTOMY  2020    Family History  Problem Relation Age of Onset   Thyroid disease Sister    Goiter Paternal Grandmother    Heart attack Mother    Colon cancer Father        Patient passed away unknown    Social History   Socioeconomic History   Marital status: Legally Separated    Spouse name: Not on file   Number of children: 0   Years of education: Not on file   Highest education level: Not on file  Occupational History   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[employer]   Occupation: Retired  Tobacco Use   Smoking status: Never   Smokeless tobacco: Never  Substance and Sexual Activity   Alcohol use: Never   Drug use: Never   Sexual activity: Yes    Partners: Female  Other Topics Concern   Not on file  Social History Narrative   Not on file   Social Determinants of Health   Financial Resource Strain: Low Risk  (06/25/2023)   Overall Financial Resource Strain (CARDIA)    Difficulty of Paying Living Expenses:  Not hard at all  Food Insecurity: No Food Insecurity (06/25/2023)   Hunger Vital Sign    Worried About Running Out of Food in the Last Year: Never true    Ran Out of Food in the Last Year: Never true  Transportation Needs: No Transportation Needs (06/25/2023)   PRAPARE - Administrator, Civil Service (Medical): No    Lack of Transportation (Non-Medical): No  Physical Activity: Inactive (06/25/2023)   Exercise Vital Sign    Days of Exercise per Week: 0 days    Minutes of Exercise per Session: 0 min  Stress: No Stress Concern Present (06/25/2023)   [employer] of Occupational Health - Occupational Stress Questionnaire    Feeling of Stress : Not at all  Social Connections: Socially Isolated (06/25/2023)   Social Connection and Isolation Panel [NHANES]    Frequency of Communication with Friends and Family: More than three times a week    Frequency of Social Gatherings with Friends and Family: More than three times a week    Attends Religious Services: Never    Database administrator or Organizations: No    Attends Banker Meetings: Never    Marital Status: Divorced  Catering manager Violence: Not At Risk (06/25/2023)  Humiliation, Afraid, Rape, and Kick questionnaire    Fear of Current or Ex-Partner: No    Emotionally Abused: No    Physically Abused: No    Sexually Abused: No    Outpatient Medications Prior to Visit  Medication Sig Dispense Refill   fexofenadine (ALLEGRA) 180 MG tablet Take 180 mg by mouth daily.     lisinopril (ZESTRIL) 20 MG tablet Take 1 tablet (20 mg total) by mouth daily. 90 tablet 2   methimazole (TAPAZOLE) 5 MG tablet Take 1 tablet (5 mg total) by mouth as directed. 1 tablet Monday through Saturday , 2 on Sundays 96 tablet 2   omeprazole (PRILOSEC) 40 MG capsule TAKE 1 CAPSULE BY MOUTH EVERY DAY 90 capsule 1   fluticasone (FLONASE) 50 MCG/ACT nasal spray Place 2 sprays into both nostrils daily. 16 g 6   No facility-administered  medications prior to visit.    Not on File  Review of Systems  Constitutional:  Positive for fatigue. Negative for chills and fever.  HENT:  Positive for congestion, rhinorrhea, sinus pressure and sinus pain.   Respiratory:  Positive for cough. Negative for shortness of breath.   Cardiovascular:  Negative for chest pain.  Gastrointestinal:  Positive for nausea.  Neurological:  Negative for headaches.       Objective:        07/09/2023    2:48 PM 06/25/2023   11:14 AM 03/27/2023    8:11 AM  Vitals with BMI  Height 5\' 7"  5\' 7"  5\' 7"   Weight 201 lbs 203 lbs 203 lbs 13 oz  BMI 31.47 31.79 31.91  Systolic 108 -- 124  Diastolic 60 -- 70  Pulse 78  60    No data found.   Physical Exam Vitals and nursing note reviewed.  Constitutional:      Appearance: Normal appearance.  HENT:     Head: Normocephalic.     Ears:     Comments: Slightly bulging TMs bilaterally, no signs of infection    Nose:     Comments: Erythematous boggy nasal mucosa    Mouth/Throat:     Comments: Postnasal drainage and erythema noted Cardiovascular:     Rate and Rhythm: Normal rate and regular rhythm.  Pulmonary:     Effort: Pulmonary effort is normal.     Breath sounds: Normal breath sounds.  Musculoskeletal:        General: Normal range of motion.  Neurological:     General: No focal deficit present.     Mental Status: He is alert.     Health Maintenance Due  Topic Date Due   Hepatitis C Screening  Never done   DTaP/Tdap/Td (1 - Tdap) Never done   Zoster Vaccines- Shingrix (1 of 2) Never done   Pneumonia Vaccine 55+ Years old (2 of 2 - PCV) 12/23/2012   COVID-19 Vaccine (1 - 2023-24 season) Never done   INFLUENZA VACCINE  06/11/2023    There are no preventive care reminders to display for this patient.   Lab Results  Component Value Date   TSH 0.684 03/27/2023   Lab Results  Component Value Date   WBC 8.7 03/27/2023   HGB 17.0 03/27/2023   HCT 50.6 03/27/2023   MCV 80  03/27/2023   PLT 287 03/27/2023   Lab Results  Component Value Date   NA 138 03/27/2023   K 4.5 03/27/2023   CO2 21 03/27/2023   GLUCOSE 91 03/27/2023   BUN 9 03/27/2023   CREATININE 1.05  03/27/2023   BILITOT 0.5 03/27/2023   ALKPHOS 102 03/27/2023   AST 17 03/27/2023   ALT 16 03/27/2023   PROT 7.0 03/27/2023   ALBUMIN 4.2 03/27/2023   CALCIUM 9.5 03/27/2023   EGFR 73 03/27/2023   GFR 73.79 11/10/2019   Lab Results  Component Value Date   CHOL 178 03/27/2023   Lab Results  Component Value Date   HDL 45 03/27/2023   Lab Results  Component Value Date   LDLCALC 109 (H) 03/27/2023   Lab Results  Component Value Date   TRIG 133 03/27/2023   Lab Results  Component Value Date   CHOLHDL 4.0 03/27/2023   No results found for: "HGBA1C"     Assessment & Plan:  Rhinorrhea -     POC COVID-19 BinaxNow  Acute non-recurrent maxillary sinusitis Assessment & Plan: Conell is here with symptoms of sinus pressure, headache, facial pain, postnasal drainage, productive cough, rhinorrhea for couple weeks but symptoms reportedly have been worse since yesterday  On exam, he has mild maxillary sinus tenderness bilaterally, erythematous nasal mucosa, thick postnasal drainage and slightly bulging tympanic membranes bilaterally with no signs of infection.  Plan: Could be the beginning of maxillary sinusitis or could be a flareup of his allergies -Sent a prescription for prednisone 20 mg to take 2 tablets daily for 5 to 7 days -Refill his Flonase to use after the prednisone course -Recommended plenty of fluids, steam inhalation etc. -If no improvement in symptoms towards the end of prednisone course, he could then pick up and take the antibiotic amoxicillin which I prescribed today.  Follow-up with PCP as needed   Mild intermittent asthma with acute exacerbation Assessment & Plan: Not on any inhalers at this time.  Uses allergy medications.  Does not feel the need for inhalers. He  could call back if he does.   Other orders -     Fluticasone Propionate; Place 2 sprays into both nostrils daily.  Dispense: 16 g; Refill: 6 -     predniSONE; Take 2 tablets (40 mg total) by mouth daily with breakfast for 7 days.  Dispense: 14 tablet; Refill: 0 -     Amoxicillin; Take 1 capsule (500 mg total) by mouth 3 (three) times daily for 7 days.  Dispense: 21 capsule; Refill: 0     Meds ordered this encounter  Medications   fluticasone (FLONASE) 50 MCG/ACT nasal spray    Sig: Place 2 sprays into both nostrils daily.    Dispense:  16 g    Refill:  6   predniSONE (DELTASONE) 20 MG tablet    Sig: Take 2 tablets (40 mg total) by mouth daily with breakfast for 7 days.    Dispense:  14 tablet    Refill:  0   amoxicillin (AMOXIL) 500 MG capsule    Sig: Take 1 capsule (500 mg total) by mouth 3 (three) times daily for 7 days.    Dispense:  21 capsule    Refill:  0    Orders Placed This Encounter  Procedures   POC COVID-19 BinaxNow     Follow-up: Return if symptoms worsen or fail to improve.  An After Visit Summary was printed and given to the patient.  Windell Moment, MD [facility] [phone]

## 2023-07-09 NOTE — Patient Instructions (Signed)
Could still be a flare up of your allergies Use the prednisone, continue allegra Refilled flonase Try steam inhalation and plenty of fluids If you do not have any relief of symptoms by the end of prednisone course, then start taking the antibiotic.

## 2023-09-22 ENCOUNTER — Other Ambulatory Visit: Payer: Self-pay | Admitting: Physician Assistant

## 2023-10-15 ENCOUNTER — Encounter: Payer: Self-pay | Admitting: Internal Medicine

## 2023-10-15 ENCOUNTER — Ambulatory Visit: Payer: Medicare Other | Admitting: Internal Medicine

## 2023-10-15 VITALS — BP 124/78 | HR 94 | Ht 67.0 in | Wt 194.0 lb

## 2023-10-15 DIAGNOSIS — E041 Nontoxic single thyroid nodule: Secondary | ICD-10-CM | POA: Diagnosis not present

## 2023-10-15 DIAGNOSIS — E0590 Thyrotoxicosis, unspecified without thyrotoxic crisis or storm: Secondary | ICD-10-CM | POA: Diagnosis not present

## 2023-10-15 DIAGNOSIS — E0500 Thyrotoxicosis with diffuse goiter without thyrotoxic crisis or storm: Secondary | ICD-10-CM | POA: Diagnosis not present

## 2023-10-15 NOTE — Progress Notes (Signed)
Name: [name] Huff  MRN/ DOB: [id number], 11/15/1945    Age/ Sex: 77 y.o., male     PCP: Langley Gauss, [place]   Reason for Endocrinology Evaluation: Hyperthyroidism     Initial Endocrinology Clinic Visit: 05/16/2019    PATIENT IDENTIFIER: [name] Huff is a 77 y.o., male with a past medical history of HTN. He has followed with [facility] since 05/16/2019 for consultative assistance with management of his Hyperthyroidism      HISTORICAL SUMMARY: The patient was first diagnosed with hypothyroidism many years ago, he was  on LT-4 replacement for many years up to 150 mcg daily,  but in 2019 his dose has been gradually reduced and was off for a few months prior to his presentation to our clinic.    He was diagnosed with hyperthyroidism secondary to graves' disease in 05/2019 and started on Methimazole   [facility]roid [facility] in February, 2023 revealed a solitary left thyroid nodule at 2.6 cm, meeting FNA criteria.  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 He has never used smokeless tobacco. He reports that he does not drink alcohol and does not use drugs. Family History:  Family History  Problem Relation Age of Onset   Thyroid disease Sister    Goiter Paternal Grandmother    Heart attack Mother    Colon cancer Father        Patient passed away unknown     HOME MEDICATIONS: Allergies as of 10/15/2023   Not on File      Medication List        Accurate as of October 15, 2023 11:12 AM. If you have any questions, ask your nurse or doctor.          fexofenadine 180 MG tablet Commonly known as: ALLEGRA Take 180 mg by mouth daily.   fluticasone 50 MCG/ACT nasal spray Commonly known as: FLONASE Place 2 sprays into both nostrils daily.   lisinopril 20 MG tablet Commonly known as: ZESTRIL Take 1 tablet (20 mg total) by mouth daily.   methimazole 5 MG tablet Commonly known as: TAPAZOLE Take 1 tablet (5 mg total) by mouth as directed. 1 tablet Monday through Saturday , 2 on Sundays   omeprazole 40 MG capsule Commonly known as: PRILOSEC TAKE 1 CAPSULE BY MOUTH EVERY DAY          OBJECTIVE:   PHYSICAL EXAM:  VS: BP 124/78 (BP Location: Left Arm, Patient Position: Sitting, Cuff Size: Large)   Pulse 94   Ht 5\' 7"  (1.702 m)   Wt 194 lb (88 kg)   SpO2 97%   BMI 30.38 kg/m    EXAM: General: Pt appears well and is in NAD  Neck: General: Supple without adenopathy. Thyroid: Unable to appreciate nodules on exam today  Lungs: Clear with good BS bilat   Heart: Auscultation: RRR.  Abdomen: soft, nontender  Extremities:  BL LE: 1+ non pitting edema    Mental Status: Judgment, insight: Intact Orientation: Oriented to time, place, and person Mood and affect: No depression, anxiety, or agitation     DATA REVIEWED:  Latest Reference Range & Units 10/15/23 11:30  TSH 0.40 - 4.50 mIU/L 0.05 (L)   Triiodothyronine,Free,Serum 2.3 - 4.2 pg/mL 5.3 (H)  T4,Free(Direct) 0.8 - 1.8 ng/dL 1.1  (L): Data is abnormally low (H): Data is abnormally high   Results for SKYLUR, BEDNER (MRN [id number]) as of 08/16/2019 13:51  Ref. Range 05/16/2019 15:04  TRAB Latest Ref Range: <=2.00 IU/L >40.00 (H)   Thyroid ultrasound 12/27/2022  Nodule #1 Left mid  to superior stable at  2.2 x 1.8 x 1.9 cm    ASSESSMENT / PLAN / RECOMMENDATIONS:   Hyperthyroidism Secondary To Graves' Disease:   -Pt with Hashi-Toxi given his prior diagnosis of hypothyroidism  -He is clinically euthyroid -No local neck symptoms -Patient continues to be biochemically hyperthyroid, will increase methimazole as below   Medications  Continue methimazole 5 mg, 1 tablet Monday through Thursday  , 2 tabs on Friday, Saturday and Sundays     2. Graves' Disease:    - No extra-thyroidal manifestations of graves' disease   3.  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 [place] [address] Phone: [phone]  Fax: [phone]   Return to Endocrinology clinic as below: Future Appointments  Date Time Provider Department Center  06/28/2024 10:20 AM Langley Gauss, PA [facility] None

## 2023-10-16 LAB — TSH: TSH: 0.05 m[IU]/L — ABNORMAL LOW (ref 0.40–4.50)

## 2023-10-16 LAB — T3, FREE: T3, Free: 5.3 pg/mL — ABNORMAL HIGH (ref 2.3–4.2)

## 2023-10-16 LAB — T4, FREE: Free T4: 1.1 ng/dL (ref 0.8–1.8)

## 2023-10-16 MED ORDER — METHIMAZOLE 5 MG PO TABS: 5.0000 mg | ORAL_TABLET | ORAL | 2 refills | Status: DC

## 2023-10-16 NOTE — Telephone Encounter (Signed)
Please let the patient know that his thyroid levels are overactive  He needs to take methimazole 2 tablets on Friday, Saturday, and Sunday.  But continue to take 1 tablet Monday through Thursday  He will need to have his labs checked in 2 months, historically he has had his PCP do it and they give [place] a copy  Thanks

## 2023-10-21 ENCOUNTER — Telehealth: Payer: Self-pay

## 2023-10-21 NOTE — Telephone Encounter (Signed)
Patient calling for lab results from 10/15/23

## 2023-10-21 NOTE — Telephone Encounter (Signed)
Patient aware and will make medication change

## 2023-11-26 ENCOUNTER — Ambulatory Visit: Payer: Medicare Other | Admitting: Physician Assistant

## 2023-11-26 ENCOUNTER — Encounter: Payer: Self-pay | Admitting: Physician Assistant

## 2023-11-26 VITALS — BP 124/60 | HR 83 | Temp 98.1°F | Resp 16 | Ht 67.0 in | Wt 198.0 lb

## 2023-11-26 DIAGNOSIS — M65342 Trigger finger, left ring finger: Secondary | ICD-10-CM | POA: Diagnosis not present

## 2023-11-26 DIAGNOSIS — J3089 Other allergic rhinitis: Secondary | ICD-10-CM

## 2023-11-26 DIAGNOSIS — J0100 Acute maxillary sinusitis, unspecified: Secondary | ICD-10-CM

## 2023-11-26 DIAGNOSIS — M19041 Primary osteoarthritis, right hand: Secondary | ICD-10-CM | POA: Diagnosis not present

## 2023-11-26 DIAGNOSIS — M19042 Primary osteoarthritis, left hand: Secondary | ICD-10-CM

## 2023-11-26 MED ORDER — AMOXICILLIN-POT CLAVULANATE 875-125 MG PO TABS: 1.0000 | ORAL_TABLET | Freq: Two times a day (BID) | ORAL | 0 refills | Status: DC

## 2023-11-26 MED ORDER — PREDNISONE 20 MG PO TABS: ORAL_TABLET | ORAL | 0 refills | Status: AC

## 2023-11-26 NOTE — Assessment & Plan Note (Addendum)
Morning stiffness and occasional pain in hands, worse in the right hand. No numbness or tingling. -Encouraged to keep hands moving to prevent stiffness.

## 2023-11-26 NOTE — Assessment & Plan Note (Signed)
Itchy eyes and sinus congestion. -Continue Allegra in the morning. -Add Benadryl at night. -Use eye drops for dry eyes as needed.

## 2023-11-26 NOTE — Progress Notes (Signed)
Acute Office Visit  Subjective:    Patient ID: [name] Huff, male    DOB: 25-Oct-1946, 78 y.o.   MRN: [id number]  Chief Complaint  Patient presents with   Nasal Congestion   Cough    Discussed the use of AI scribe software for clinical note transcription with the patient, who gave verbal consent to proceed.  HPI: Patient is in today for sinus congestion and drainage. Discussed the use of AI scribe software for clinical note transcription with the patient, who gave verbal consent to proceed.  History of Present Illness   [name] Huff, a 78 year old male with a history of chronic sinusitis and thyroid issues, presents with persistent sinus congestion and cough since Christmas. He reports using over-the-counter nasal sprays and Flonase, which provide temporary relief but the symptoms return. The patient describes the cough as productive with yellow sputum, which he attributes to postnasal drainage. He denies any fever or ear pain. He also mentions occasional headaches and pressure in the sinus area.  In addition to his sinus issues, the patient reports experiencing arthritis-like symptoms in his hands, particularly in the morning. He describes the sensation as sore and touchy, but denies any numbness or tingling. He notes that the symptoms seem to improve as he uses his hands throughout the day. The patient has a family history of severe arthritis.      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     Past Surgical History:  Procedure Laterality Date   CATARACT EXTRACTION     TONSILECTOMY/ADENOIDECTOMY WITH MYRINGOTOMY  2020    Family History  Problem Relation Age of Onset   Thyroid disease Sister    Goiter Paternal Grandmother    Heart attack Mother    Colon cancer Father        Patient passed away  unknown    Social History   Socioeconomic History   Marital status: Legally Separated    Spouse name: Not on file   Number of children: 0   Years of education: Not on file   Highest education level: Not on file  Occupational History   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[employer]   Occupation: Retired  Tobacco Use   Smoking status: Never   Smokeless tobacco: Never  Substance and Sexual Activity   Alcohol use: Never   Drug use: Never   Sexual activity: Yes    Partners: Female  Other Topics Concern   Not on file  Social History Narrative   Not on file   Social Drivers of Health   Financial Resource Strain: Low Risk  (06/25/2023)   Overall Financial Resource Strain (CARDIA)    Difficulty of Paying Living Expenses: Not hard at all  Food Insecurity: No Food Insecurity (06/25/2023)   Hunger Vital Sign    Worried About Running Out of Food in the Last Year: Never true    Ran Out of Food in the Last Year: Never true  Transportation Needs: No Transportation Needs (06/25/2023)   PRAPARE - Administrator, Civil Service (Medical): No    Lack of Transportation (Non-Medical): No  Physical Activity: Inactive (06/25/2023)   Exercise Vital Sign    Days of Exercise per Week: 0 days    Minutes of Exercise per Session:  0 min  Stress: No Stress Concern Present (06/25/2023)   [employer] of Occupational Health - Occupational Stress Questionnaire    Feeling of Stress : Not at all  Social Connections: Socially Isolated (06/25/2023)   Social Connection and Isolation Panel [NHANES]    Frequency of Communication with Friends and Family: More than three times a week    Frequency of Social Gatherings with Friends and Family: More than three times a week    Attends Religious Services: Never    Database administrator or Organizations: No    Attends Banker Meetings: Never    Marital Status: Divorced  Catering manager Violence: Not At Risk  (06/25/2023)   Humiliation, Afraid, Rape, and Kick questionnaire    Fear of Current or Ex-Partner: No    Emotionally Abused: No    Physically Abused: No    Sexually Abused: No    Outpatient Medications Prior to Visit  Medication Sig Dispense Refill   fexofenadine (ALLEGRA) 180 MG tablet Take 180 mg by mouth daily.     fluticasone (FLONASE) 50 MCG/ACT nasal spray Place 2 sprays into both nostrils daily. 16 g 6   lisinopril (ZESTRIL) 20 MG tablet Take 1 tablet (20 mg total) by mouth daily. 90 tablet 2   methimazole (TAPAZOLE) 5 MG tablet Take 1 tablet (5 mg total) by mouth as directed. 1 tablet Monday through Thursday , 2 tabs on Friday, Saturday and Sundays 130 tablet 2   omeprazole (PRILOSEC) 40 MG capsule TAKE 1 CAPSULE BY MOUTH EVERY DAY 90 capsule 1   No facility-administered medications prior to visit.    Not on File  Review of Systems  Constitutional:  Negative for activity change, chills, fatigue and fever.  HENT:  Positive for congestion, postnasal drip, rhinorrhea, sinus pressure and sore throat. Negative for dental problem, drooling, ear discharge, ear pain and tinnitus.   Eyes:  Positive for itching. Negative for pain and discharge.  Respiratory:  Positive for cough. Negative for chest tightness, shortness of breath and wheezing.   Cardiovascular:  Negative for chest pain.  Gastrointestinal:  Negative for abdominal pain, blood in stool, constipation, diarrhea, nausea and vomiting.  Genitourinary:  Negative for difficulty urinating, flank pain, frequency and hematuria.  Musculoskeletal:  Negative for arthralgias, gait problem and myalgias.  Neurological:  Positive for headaches. Negative for dizziness, tremors, seizures, syncope and light-headedness.  Psychiatric/Behavioral:  Negative for agitation, behavioral problems and decreased concentration.        Objective:        11/26/2023    3:06 PM 10/15/2023   11:04 AM 07/09/2023    2:48 PM  Vitals with BMI  Height 5\' 7"   5\' 7"  5\' 7"   Weight 198 lbs 194 lbs 201 lbs  BMI 31 30.38 31.47  Systolic 124 124 742  Diastolic 60 78 60  Pulse 83 94 78    Orthostatic VS for the past 72 hrs (Last 3 readings):  Patient Position BP Location Cuff Size  11/26/23 1506 Sitting Right Arm Large     Physical Exam Vitals reviewed.  Constitutional:      Appearance: Normal appearance.  HENT:     Head: Normocephalic.     Salivary Glands: Right salivary gland is not diffusely enlarged or tender. Left salivary gland is not diffusely enlarged or tender.     Right Ear: Hearing and ear canal normal. No tenderness. A middle ear effusion is present. There is no impacted cerumen. Tympanic membrane is injected and bulging. Tympanic  membrane is not erythematous.     Left Ear: Hearing and ear canal normal. No tenderness. A middle ear effusion is present. There is no impacted cerumen. Tympanic membrane is injected and bulging. Tympanic membrane is not erythematous.     Nose: Congestion present.     Right Nostril: No foreign body.     Left Nostril: No foreign body.     Right Turbinates: Enlarged and swollen.     Left Turbinates: Enlarged and swollen.     Right Sinus: No maxillary sinus tenderness or frontal sinus tenderness.     Left Sinus: No maxillary sinus tenderness or frontal sinus tenderness.     Mouth/Throat:     Mouth: Mucous membranes are moist.     Tongue: No lesions. Tongue does not deviate from midline.     Pharynx: Oropharynx is clear. Posterior oropharyngeal erythema present.  Neck:     Vascular: No carotid bruit.  Cardiovascular:     Rate and Rhythm: Normal rate and regular rhythm.     Heart sounds: Normal heart sounds.  Pulmonary:     Effort: Pulmonary effort is normal.     Breath sounds: Normal breath sounds.  Abdominal:     General: Bowel sounds are normal.     Palpations: Abdomen is soft.     Tenderness: There is no abdominal tenderness.  Musculoskeletal:     Right hand: Deformity present. Normal range of  motion. Normal strength. Normal sensation.     Left hand: Deformity present. Normal range of motion. Normal strength. Normal sensation.     Comments: MCP joint enlargement due to arthritis bilaterally  Neurological:     Mental Status: He is alert and oriented to person, place, and time.  Psychiatric:        Mood and Affect: Mood normal.        Behavior: Behavior normal.     Health Maintenance Due  Topic Date Due   Hepatitis C Screening  Never done   DTaP/Tdap/Td (1 - Tdap) Never done   Zoster Vaccines- Shingrix (1 of 2) Never done   Pneumonia Vaccine 39+ Years old (2 of 2 - PCV) 12/23/2012   COVID-19 Vaccine (1 - 2024-25 season) Never done    There are no preventive care reminders to display for this patient.   Lab Results  Component Value Date   TSH 0.05 (L) 10/15/2023   Lab Results  Component Value Date   WBC 8.7 03/27/2023   HGB 17.0 03/27/2023   HCT 50.6 03/27/2023   MCV 80 03/27/2023   PLT 287 03/27/2023   Lab Results  Component Value Date   NA 138 03/27/2023   K 4.5 03/27/2023   CO2 21 03/27/2023   GLUCOSE 91 03/27/2023   BUN 9 03/27/2023   CREATININE 1.05 03/27/2023   BILITOT 0.5 03/27/2023   ALKPHOS 102 03/27/2023   AST 17 03/27/2023   ALT 16 03/27/2023   PROT 7.0 03/27/2023   ALBUMIN 4.2 03/27/2023   CALCIUM 9.5 03/27/2023   EGFR 73 03/27/2023   GFR 73.79 11/10/2019   Lab Results  Component Value Date   CHOL 178 03/27/2023   Lab Results  Component Value Date   HDL 45 03/27/2023   Lab Results  Component Value Date   LDLCALC 109 (H) 03/27/2023   Lab Results  Component Value Date   TRIG 133 03/27/2023   Lab Results  Component Value Date   CHOLHDL 4.0 03/27/2023   No results found for: "HGBA1C"  Assessment & Plan:  Acute non-recurrent maxillary sinusitis Assessment & Plan: Persistent congestion with yellow sputum and sinus pressure. No fever or ear pain. Nasal examination revealed fluid, especially on the right side. -Start  Augmentin for suspected sinus infection. -Continue Flonase as needed. -Use oxymetazoline nasal spray twice daily. -Check in 1 week to assess response to treatment. If no improvement, consider stronger antibiotic or ENT referral.  Orders: -     Amoxicillin-Pot Clavulanate; Take 1 tablet by mouth 2 (two) times daily.  Dispense: 14 tablet; Refill: 0 -     predniSONE; Take 3 tablets (60 mg total) by mouth daily with breakfast for 3 days, THEN 2 tablets (40 mg total) daily with breakfast for 3 days, THEN 1 tablet (20 mg total) daily with breakfast for 3 days.  Dispense: 18 tablet; Refill: 0  Environmental and seasonal allergies Assessment & Plan: Itchy eyes and sinus congestion. -Continue Allegra in the morning. -Add Benadryl at night. -Use eye drops for dry eyes as needed.   Primary osteoarthritis of both hands Assessment & Plan: Morning stiffness and occasional pain in hands, worse in the right hand. No numbness or tingling. -Encouraged to keep hands moving to prevent stiffness.    Trigger finger, left ring finger Assessment & Plan: -Consider injection if symptoms persist after Prednisone course.       Meds ordered this encounter  Medications   amoxicillin-clavulanate (AUGMENTIN) 875-125 MG tablet    Sig: Take 1 tablet by mouth 2 (two) times daily.    Dispense:  14 tablet    Refill:  0   predniSONE (DELTASONE) 20 MG tablet    Sig: Take 3 tablets (60 mg total) by mouth daily with breakfast for 3 days, THEN 2 tablets (40 mg total) daily with breakfast for 3 days, THEN 1 tablet (20 mg total) daily with breakfast for 3 days.    Dispense:  18 tablet    Refill:  0    No orders of the defined types were placed in this encounter.       Follow-up: Return if symptoms worsen or fail to improve.  An After Visit Summary was printed and given to the patient.  Langley Gauss, [place] [facility] [phone]

## 2023-11-26 NOTE — Assessment & Plan Note (Signed)
Persistent congestion with yellow sputum and sinus pressure. No fever or ear pain. Nasal examination revealed fluid, especially on the right side. -Start Augmentin for suspected sinus infection. -Continue Flonase as needed. -Use oxymetazoline nasal spray twice daily. -Check in 1 week to assess response to treatment. If no improvement, consider stronger antibiotic or ENT referral.

## 2023-11-26 NOTE — Assessment & Plan Note (Signed)
-  Consider injection if symptoms persist after Prednisone course.

## 2023-12-15 ENCOUNTER — Other Ambulatory Visit: Payer: Self-pay | Admitting: Physician Assistant

## 2023-12-15 ENCOUNTER — Telehealth: Payer: Self-pay

## 2023-12-15 DIAGNOSIS — J0100 Acute maxillary sinusitis, unspecified: Secondary | ICD-10-CM

## 2023-12-15 MED ORDER — CLINDAMYCIN HCL 300 MG PO CAPS
300.0000 mg | ORAL_CAPSULE | Freq: Three times a day (TID) | ORAL | 0 refills | Status: DC
Start: 2023-12-15 — End: 2024-06-29

## 2023-12-15 NOTE — Telephone Encounter (Signed)
 Copied from CRM [id number]. Topic: Clinical - Medical Advice >> Dec 15, 2023  1:13 PM [name] Huff wrote: Reason for CRM: Patient was seen by [name] a few weeks ago, he has finished his course of antibotics, he was advised to call back if symptoms do not improve or return and Nola would call in something stronger- patient reports that a day or two after he finished the antibotic, he started to have the following symptoms: runny nose, sneezing, coughing, no fever  He is requesting if the prescription can be sent to the following pharmacy:  [id number] [place] - [address] [phone] [address] [place] [phone]  Please call him to confirm or if you have any questions for him.

## 2023-12-16 NOTE — Telephone Encounter (Signed)
 Patient informed and aware that if symptoms do not improve with ax bx agreed to referral to ENT.

## 2024-01-06 ENCOUNTER — Other Ambulatory Visit: Payer: Self-pay | Admitting: Physician Assistant

## 2024-03-30 ENCOUNTER — Other Ambulatory Visit: Payer: Self-pay | Admitting: Physician Assistant

## 2024-04-18 ENCOUNTER — Encounter: Payer: Self-pay | Admitting: Internal Medicine

## 2024-04-18 ENCOUNTER — Ambulatory Visit: Payer: Medicare Other | Admitting: Internal Medicine

## 2024-04-18 VITALS — BP 120/80 | HR 72 | Ht 67.0 in | Wt 195.0 lb

## 2024-04-18 DIAGNOSIS — E0590 Thyrotoxicosis, unspecified without thyrotoxic crisis or storm: Secondary | ICD-10-CM

## 2024-04-18 DIAGNOSIS — E0500 Thyrotoxicosis with diffuse goiter without thyrotoxic crisis or storm: Secondary | ICD-10-CM | POA: Diagnosis not present

## 2024-04-18 DIAGNOSIS — E041 Nontoxic single thyroid nodule: Secondary | ICD-10-CM | POA: Diagnosis not present

## 2024-04-18 NOTE — Progress Notes (Unsigned)
 Name: [name] Huff  MRN/ DOB: [id number], 11-May-1946    Age/ Sex: 78 y.o., male     PCP: Odilia Bennett, [place]   Reason for Endocrinology Evaluation: Hyperthyroidism     Initial Endocrinology Clinic Visit: 05/16/2019    PATIENT IDENTIFIER: [name] is a 78 y.o., male with a past medical history of HTN. He has followed with [facility] since 05/16/2019 for consultative assistance with management of his Hyperthyroidism      HISTORICAL SUMMARY: The patient was first diagnosed with hypothyroidism many years ago, he was  on LT-4 replacement for many years up to 150 mcg daily,  but in 2019 his dose has been gradually reduced and was off for a few months prior to his presentation to our clinic.    He was diagnosed with hyperthyroidism secondary to graves' disease in 05/2019 and started on Methimazole    Thyroid  ultrasound at [facility] in February, 2023 revealed a solitary left thyroid  nodule at 2.6 cm, meeting FNA criteria.  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 He has never used smokeless tobacco. He reports that he does not drink alcohol and does not use drugs. Family History:  Family History  Problem Relation Age of Onset   Thyroid  disease Sister    Goiter Paternal Grandmother    Heart attack Mother    Colon cancer Father        Patient passed away unknown     HOME MEDICATIONS: Allergies as of 04/18/2024   Not on File      Medication List        Accurate as of April 18, 2024  7:32 AM. If you have any questions, ask your nurse or doctor.          clindamycin  300 MG capsule Commonly known as: Cleocin  Take 1 capsule (300 mg total) by mouth 3 (three) times daily.   fexofenadine 180 MG tablet Commonly known as: ALLEGRA Take 180 mg by mouth daily.   fluticasone  50 MCG/ACT nasal spray Commonly known as: FLONASE  Place 2 sprays into both nostrils daily.   lisinopril  20 MG tablet Commonly known as: ZESTRIL  TAKE 1 TABLET BY MOUTH DAILY   methimazole  5 MG tablet Commonly known as: TAPAZOLE  Take 1 tablet (5 mg total) by mouth as directed. 1 tablet Monday through Thursday , 2 tabs on Friday, Saturday and Sundays   omeprazole  40 MG capsule Commonly known as: PRILOSEC TAKE 1 CAPSULE BY MOUTH EVERY DAY  OBJECTIVE:   PHYSICAL EXAM: VS: There were no vitals taken for this visit.   EXAM: General: Pt appears well and is in NAD  Neck: General: Supple without adenopathy. Thyroid : Unable to appreciate nodules on exam today  Lungs: Clear with good BS bilat   Heart: Auscultation: RRR.  Abdomen: soft, nontender  Extremities:  BL LE: 1+ non pitting edema    Mental Status: Judgment, insight: Intact Orientation: Oriented to time, place, and person Mood and affect: No depression, anxiety, or agitation     DATA REVIEWED:  Latest Reference Range & Units 10/15/23 11:30  TSH 0.40 - 4.50 mIU/L 0.05 (L)  Triiodothyronine,Free,Serum 2.3 - 4.2 pg/mL 5.3 (H)   T4,Free(Direct) 0.8 - 1.8 ng/dL 1.1  (L): Data is abnormally low (H): Data is abnormally high   Results for [name] Huff, [name] Huff (MRN [id number]) as of 08/16/2019 13:51  Ref. Range 05/16/2019 15:04  TRAB Latest Ref Range: <=2.00 IU/L >40.00 (H)   Thyroid  ultrasound 12/27/2022  Nodule #1 Left mid  to superior stable at  2.2 x 1.8 x 1.9 cm    ASSESSMENT / PLAN / RECOMMENDATIONS:   Hyperthyroidism Secondary To Graves' Disease:   -Pt with Hashi-Toxi given his prior diagnosis of hypothyroidism  -He is clinically euthyroid -No local neck symptoms -Patient continues to be biochemically hyperthyroid, will increase methimazole  as below   Medications  Continue methimazole  5 mg, 1 tablet Monday through Thursday  , 2 tabs on Friday, Saturday and Sundays     2. Graves' Disease:    - No extra-thyroidal manifestations of graves' disease   3.  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 [address] [place] [place], [place] [address] Phone: [phone] FAX: [phone]      CC: Odilia Bennett, PA [address] [place] [address] Phone: [phone]  Fax: [phone]   Return to Endocrinology clinic as below: Future Appointments  Date Time Provider Department Center  04/18/2024 11:30 AM Anselma Herbel, Julian Obey, MD [facility] None  06/28/2024 10:20 AM Odilia Bennett, PA [facility] None

## 2024-04-19 ENCOUNTER — Ambulatory Visit: Payer: Self-pay | Admitting: Internal Medicine

## 2024-04-19 LAB — T4, FREE: Free T4: 1.1 ng/dL (ref 0.8–1.8)

## 2024-04-19 LAB — TSH: TSH: 0.24 m[IU]/L — ABNORMAL LOW (ref 0.40–4.50)

## 2024-04-19 MED ORDER — METHIMAZOLE 5 MG PO TABS: 5.0000 mg | ORAL_TABLET | ORAL | 2 refills | Status: DC

## 2024-04-19 NOTE — Telephone Encounter (Signed)
 Please let the patient know that his thyroid  function is improving but I would like to increase it to 2 tablets on Thursday, Friday, Saturday, and Sundays and continue to take 1 tablet Monday, Tuesday, and Wednesday  If he can have his labs rechecked at his PCPs office in 2 months or if he is going to be in town we will be happy to do it for him   Thanks

## 2024-05-12 ENCOUNTER — Telehealth: Payer: Self-pay | Admitting: Internal Medicine

## 2024-05-12 NOTE — Telephone Encounter (Signed)
 Patient notified and verbalized understanding.

## 2024-05-12 NOTE — Telephone Encounter (Signed)
-----   Message from Nurse Dietrich PARAS sent at 05/09/2024 10:19 AM EDT ----- U/S of head and neck

## 2024-05-12 NOTE — Telephone Encounter (Signed)
 Please let the patient know that his thyroid  ultrasound continues to show a stable thyroid  nodule, which is good news, no intervention needed at this time     Thanks

## 2024-05-27 ENCOUNTER — Ambulatory Visit: Payer: Self-pay | Admitting: *Deleted

## 2024-05-27 NOTE — Telephone Encounter (Signed)
 FYI Only or Action Required?: Action required by provider: clinical question for provider and requesting medication antibiotic.  Patient was last seen in primary care on 11/26/2023 by [name] Cleaves, PA.  Called Nurse Triage reporting Facial Pain and Nasal Congestion.  Symptoms began several weeks ago.  Interventions attempted: OTC medications: na.  Symptoms are: unchanged.  Triage Disposition: See PCP When Office is Open (Within 3 Days)  Patient/caregiver understands and will follow disposition?: No, wishes to speak with PCP               Copied from CRM [id number]. Topic: Clinical - Red Word Triage >> May 27, 2024 10:05 AM [name] Huff wrote: Red Word that prompted transfer to Nurse Triage: Patient is calling to report drainage down that back of his throat that is making him nauseous. Patient reporting this a sinus infection. With pressure under his eye. His eyes are watering. Patient reporting prednisone  and amoxicillin  (AMOXIL ) 875 MG tablet [[id number]]  ENDED Reason for Disposition  [1] Sinus congestion (pressure, fullness) AND [2] present > 10 days  Answer Assessment - Initial Assessment Questions Declined appt today with other provider. No available appt unitl 05/30/24 with PCP. Patient reports he has another appt Monday and did not want to schedule. Requesting amoxicillin  sent to [facility] in [place] [phone] if PCP will prescribe medication . Reinforced PCP will need to see pt. Patient would like a call back regarding if his insurance is in network before scheduling appt. Please advise. Recommended if sx worsening go to UC.        1. LOCATION: Where does it hurt?      Pressure under eyes 2. ONSET: When did the sinus pain start?  (e.g., hours, days)      On going x 3 weeks  3. SEVERITY: How bad is the pain?   (Scale 0-10; or none, mild, moderate or severe)     No severe but drainage makes nausea 4. RECURRENT SYMPTOM: Have you ever had sinus problems  before? If Yes, ask: When was the last time? and What happened that time?      Yes took antibiotics and cleared  5. NASAL CONGESTION: Is the nose blocked? If Yes, ask: Can you open it or must you breathe through your mouth?     Na  6. NASAL DISCHARGE: Do you have discharge from your nose? If so ask, What color?     Drainage back of throat 7. FEVER: Do you have a fever? If Yes, ask: What is it, how was it measured, and when did it start?      Na  8. OTHER SYMPTOMS: Do you have any other symptoms? (e.g., sore throat, cough, earache, difficulty breathing)     Pressure under eyes. Eyes watering, drainage back of throat causing nausea 9. PREGNANCY: Is there any chance you are pregnant? When was your last menstrual period?     na  Protocols used: Sinus Pain or Congestion-A-AH

## 2024-05-31 NOTE — Telephone Encounter (Signed)
 Patient has appointment for 06/01/2024.

## 2024-05-31 NOTE — Telephone Encounter (Signed)
 Attempted to reach patient regarding his symptoms. Left voicemail for patient to call back.

## 2024-06-01 ENCOUNTER — Encounter: Payer: Self-pay | Admitting: Physician Assistant

## 2024-06-01 ENCOUNTER — Ambulatory Visit (INDEPENDENT_AMBULATORY_CARE_PROVIDER_SITE_OTHER): Admitting: Physician Assistant

## 2024-06-01 VITALS — BP 118/60 | HR 64 | Temp 98.1°F | Resp 18 | Ht 67.0 in | Wt 193.4 lb

## 2024-06-01 DIAGNOSIS — I10 Essential (primary) hypertension: Secondary | ICD-10-CM

## 2024-06-01 DIAGNOSIS — E0590 Thyrotoxicosis, unspecified without thyrotoxic crisis or storm: Secondary | ICD-10-CM | POA: Diagnosis not present

## 2024-06-01 DIAGNOSIS — J0100 Acute maxillary sinusitis, unspecified: Secondary | ICD-10-CM

## 2024-06-01 DIAGNOSIS — R600 Localized edema: Secondary | ICD-10-CM | POA: Diagnosis not present

## 2024-06-01 MED ORDER — PREDNISONE 20 MG PO TABS: ORAL_TABLET | ORAL | 0 refills | Status: AC

## 2024-06-01 MED ORDER — AZITHROMYCIN 250 MG PO TABS: ORAL_TABLET | ORAL | 0 refills | Status: DC

## 2024-06-01 MED ORDER — LISINOPRIL 10 MG PO TABS
10.0000 mg | ORAL_TABLET | Freq: Every day | ORAL | 3 refills | Status: DC
Start: 2024-06-01 — End: 2024-09-19

## 2024-06-01 NOTE — Progress Notes (Signed)
 Subjective:  Patient ID: [name] Huff, male    DOB: 1946-07-05  Age: 78 y.o. MRN: [id number]  Chief Complaint  Patient presents with   Sinusitis    HPI:  Discussed the use of AI scribe software for clinical note transcription with the patient, who gave verbal consent to proceed.  History of Present Illness   [name] Huff is a 78 year old male with chronic sinus issues who presents with sinus pressure and itching eyes.  He has experienced chronic sinus issues for several years, characterized by pressure primarily in the sinus area rather than behind the eyes. He occasionally experiences itching in the eyes, for which he uses eye drops. Previous treatments with injections and prednisone  have provided temporary relief for two to four months before symptoms recur. Sinus issues are familial.  He recently underwent a thyroid  ultrasound, which was normal. He has a history of a thyroid  nodule that was biopsied and found to be benign. He is required to have annual ultrasounds for five years, with two more remaining.  He reports itching and occasional sharp pain in his legs, present for a couple of months. He applies cortisone cream to manage the itching. No heaviness, numbness, or discoloration in the legs.  He is currently taking Allegra once daily for allergies and lisinopril  every other day for blood pressure management, as he found his blood pressure was not high enough to warrant daily use. No dizziness or lightheadedness when taking lisinopril .          06/25/2023   11:21 AM 03/27/2023    8:15 AM 11/25/2021    3:31 PM 11/06/2020    9:13 AM 06/11/2020    7:42 AM  Depression screen PHQ 2/9  Decreased Interest 0 0 0 0 0  Down, Depressed, Hopeless 0 0 0 0 0  PHQ - 2 Score 0 0 0 0 0  Altered sleeping  0     Tired, decreased energy  0     Change in appetite  0     Feeling bad or failure about yourself   0     Trouble concentrating  0     Moving slowly or fidgety/restless  0      Suicidal thoughts  0     PHQ-9 Score  0     Difficult doing work/chores  Not difficult at all           06/25/2023   11:22 AM  Fall Risk   Falls in the past year? 0  Number falls in past yr: 0  Injury with Fall? 0  Risk for fall due to : No Fall Risks  Follow up Falls prevention discussed    Patient Care Team: Milon Cleaves, PA as PCP - General (Physician Assistant) Shamleffer, Donell Cardinal, MD as Attending Physician (Endocrinology)   Review of Systems  Constitutional:  Negative for appetite change, chills, fatigue and fever.  HENT:  Positive for congestion, postnasal drip, rhinorrhea and sinus pressure. Negative for ear pain and sore throat.   Eyes: Negative.   Respiratory:  Negative for cough, chest tightness, shortness of breath and wheezing.   Cardiovascular:  Negative for chest pain and palpitations.  Gastrointestinal:  Positive for nausea. Negative for abdominal pain, constipation, diarrhea and vomiting.  Endocrine: Negative.   Genitourinary:  Negative for dysuria, frequency and hematuria.  Musculoskeletal:  Negative for arthralgias, back pain, joint swelling and myalgias.  Skin:  Negative for rash.  Allergic/Immunologic: Negative.   Neurological:  Negative  for dizziness, weakness and headaches.  Hematological: Negative.   Psychiatric/Behavioral:  Negative for dysphoric mood. The patient is not nervous/anxious.     Current Outpatient Medications on File Prior to Visit  Medication Sig Dispense Refill   clindamycin  (CLEOCIN ) 300 MG capsule Take 1 capsule (300 mg total) by mouth 3 (three) times daily. 21 capsule 0   fexofenadine (ALLEGRA) 180 MG tablet Take 180 mg by mouth daily.     fluticasone  (FLONASE ) 50 MCG/ACT nasal spray Place 2 sprays into both nostrils daily. 16 g 6   methimazole  (TAPAZOLE ) 5 MG tablet Take 1 tablet (5 mg total) by mouth as directed. 1 tablet Monday through Wednesday  , 2 tabs Thursday, Friday, Saturday and Sundays 143 tablet 2   omeprazole   (PRILOSEC) 40 MG capsule TAKE 1 CAPSULE BY MOUTH EVERY DAY 90 capsule 1   No current facility-administered medications on file prior to visit.   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    Past Surgical History:  Procedure Laterality Date   CATARACT EXTRACTION     TONSILECTOMY/ADENOIDECTOMY WITH MYRINGOTOMY  2020    Family History  Problem Relation Age of Onset   Thyroid  disease Sister    Goiter Paternal Grandmother    Heart attack Mother    Colon cancer Father        Patient passed away unknown   Social History   Socioeconomic History   Marital status: Legally Separated    Spouse name: Not on file   Number of children: 0   Years of education: Not on file   Highest education level: Not on file  Occupational History   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[employer]   Occupation: Retired  Tobacco Use   Smoking status: Never   Smokeless tobacco: Never  Substance and Sexual Activity   Alcohol use: Never   Drug use: Never   Sexual activity: Yes    Partners: Female  Other Topics Concern   Not on file  Social History Narrative   Not on file   Social Drivers of Health   Financial Resource Strain: Low Risk  (06/25/2023)   Overall Financial Resource Strain (CARDIA)    Difficulty of Paying Living Expenses: Not hard at all  Food Insecurity: No Food Insecurity (06/25/2023)   Hunger Vital Sign    Worried About Running Out of Food in the Last Year: Never true    Ran Out of Food in the Last Year: Never true  Transportation Needs: No Transportation Needs (06/25/2023)   PRAPARE - Administrator, Civil Service (Medical): No    Lack of Transportation (Non-Medical): No  Physical Activity: Inactive (06/25/2023)   Exercise Vital Sign    Days of Exercise per Week: 0 days     Minutes of Exercise per Session: 0 min  Stress: No Stress Concern Present (06/25/2023)   [employer] of Occupational Health - Occupational Stress Questionnaire    Feeling of Stress : Not at all  Social Connections: Socially Isolated (06/25/2023)   Social Connection and Isolation Panel    Frequency of Communication with Friends and Family: More than three times a week    Frequency of Social Gatherings with Friends and Family: More than three times a week    Attends Religious Services:  Never    Active Member of Clubs or Organizations: No    Attends Banker Meetings: Never    Marital Status: Divorced    Objective:  BP 118/60   Pulse 64   Temp 98.1 F (36.7 C) (Temporal)   Resp 18   Ht 5' 7 (1.702 m)   Wt 193 lb 6.4 oz (87.7 kg)   SpO2 99%   BMI 30.29 kg/m      06/01/2024    1:27 PM 04/18/2024   11:35 AM 11/26/2023    3:06 PM  BP/Weight  Systolic BP 118 120 124  Diastolic BP 60 80 60  Wt. (Lbs) 193.4 195 198  BMI 30.29 kg/m2 30.54 kg/m2 31.01 kg/m2    Physical Exam Vitals reviewed.  Constitutional:      Appearance: Normal appearance.  HENT:     Right Ear: Hearing and ear canal normal. Drainage and swelling present. No tenderness. A middle ear effusion is present. Tympanic membrane is perforated. Tympanic membrane is not erythematous or bulging.     Left Ear: Hearing and ear canal normal. Swelling present. No drainage or tenderness. A middle ear effusion is present. Tympanic membrane is bulging. Tympanic membrane is not perforated or erythematous.     Ears:     Comments: No active sign of infection in right ear Clear drainage seen through possible perforation.  Cardiovascular:     Rate and Rhythm: Normal rate and regular rhythm.     Heart sounds: Normal heart sounds.  Pulmonary:     Effort: Pulmonary effort is normal.     Breath sounds: Normal breath sounds.  Abdominal:     General: Bowel sounds are normal.     Palpations: Abdomen is soft.      Tenderness: There is no abdominal tenderness.  Musculoskeletal:     Right lower leg: 1+ Pitting Edema present.     Left lower leg: 1+ Pitting Edema present.  Neurological:     Mental Status: He is alert and oriented to person, place, and time.  Psychiatric:        Mood and Affect: Mood normal.        Behavior: Behavior normal.         Lab Results  Component Value Date   WBC 8.7 03/27/2023   HGB 17.0 03/27/2023   HCT 50.6 03/27/2023   PLT 287 03/27/2023   GLUCOSE 91 03/27/2023   CHOL 178 03/27/2023   TRIG 133 03/27/2023   HDL 45 03/27/2023   LDLCALC 109 (H) 03/27/2023   ALT 16 03/27/2023   AST 17 03/27/2023   NA 138 03/27/2023   K 4.5 03/27/2023   CL 101 03/27/2023   CREATININE 1.05 03/27/2023   BUN 9 03/27/2023   CO2 21 03/27/2023   TSH 0.24 (L) 04/18/2024      Assessment & Plan:  Primary hypertension Assessment & Plan: Hypertension managed with lisinopril . Self-adjusted dose due to hypotension concerns. - Prescribe lisinopril  10 mg daily once current supply is finished. BP Readings from Last 3 Encounters:  06/01/24 118/60  04/18/24 120/80  11/26/23 124/60     Orders: -     Lisinopril ; Take 1 tablet (10 mg total) by mouth daily.  Dispense: 90 tablet; Refill: 3  Acute non-recurrent maxillary sinusitis -     Azithromycin ; 2 DAILY FOR FIRST DAY, THEN DECREASE TO ONE DAILY FOR 4 MORE DAYS.  Dispense: 6 tablet; Refill: 0 -     predniSONE ; Take 3 tablets (60 mg total) by  mouth daily with breakfast for 3 days, THEN 2 tablets (40 mg total) daily with breakfast for 3 days, THEN 1 tablet (20 mg total) daily with breakfast for 3 days.  Dispense: 18 tablet; Refill: 0  Hyperthyroidism Assessment & Plan: Stable thyroid  nodule with no changes over three years. - Continue annual thyroid  ultrasound for two more years.    Peripheral edema Assessment & Plan: Fluid stasis with possible lymphatic fluid leakage. No compression socks used. - Recommend wearing compression  socks to reduce fluid buildup. - Order vascular ultrasound of legs to assess for venous insufficiency.  Orders: -     VAS US  LOWER EXTREMITY VENOUS (DVT); Future     Meds ordered this encounter  Medications   azithromycin  (ZITHROMAX ) 250 MG tablet    Sig: 2 DAILY FOR FIRST DAY, THEN DECREASE TO ONE DAILY FOR 4 MORE DAYS.    Dispense:  6 tablet    Refill:  0   predniSONE  (DELTASONE ) 20 MG tablet    Sig: Take 3 tablets (60 mg total) by mouth daily with breakfast for 3 days, THEN 2 tablets (40 mg total) daily with breakfast for 3 days, THEN 1 tablet (20 mg total) daily with breakfast for 3 days.    Dispense:  18 tablet    Refill:  0   lisinopril  (ZESTRIL ) 10 MG tablet    Sig: Take 1 tablet (10 mg total) by mouth daily.    Dispense:  90 tablet    Refill:  3    Orders Placed This Encounter  Procedures   VAS US  LOWER EXTREMITY VENOUS (DVT)     Follow-up: Return if symptoms worsen or fail to improve, for [facility].   I,Angela Taylor,acting as a Neurosurgeon for [employer], PA.,have documented all relevant documentation on the behalf of [name], PA,as directed by  [name], PA while in the presence of [name], [place].   An After Visit Summary was printed and given to the patient.  [name], [place] [facility] [phone]

## 2024-06-01 NOTE — Assessment & Plan Note (Addendum)
 Hypertension managed with lisinopril . Self-adjusted dose due to hypotension concerns. - Prescribe lisinopril  10 mg daily once current supply is finished. BP Readings from Last 3 Encounters:  06/01/24 118/60  04/18/24 120/80  11/26/23 124/60

## 2024-06-01 NOTE — Assessment & Plan Note (Signed)
 Stable thyroid  nodule with no changes over three years. - Continue annual thyroid  ultrasound for two more years.

## 2024-06-01 NOTE — Assessment & Plan Note (Signed)
 Fluid stasis with possible lymphatic fluid leakage. No compression socks used. - Recommend wearing compression socks to reduce fluid buildup. - Order vascular ultrasound of legs to assess for venous insufficiency.

## 2024-06-01 NOTE — Patient Instructions (Signed)
 VISIT SUMMARY:  During your visit, we discussed your ongoing struggles with complex grief, anxiety, and depression following the unexpected death of your partner. We also addressed your difficulty sleeping and recent high blood pressure reading. We reviewed your current medications and made some adjustments to better manage your symptoms. Additionally, we talked about your new job and plans to move to a new apartment.  YOUR PLAN:  -COMPLEX GRIEF WITH ANXIETY AND DEPRESSION: You are experiencing complex grief, which is a prolonged and intense form of grief, along with anxiety and depression. We will increase your Vraylar dose to 6 mg daily for one month and continue your current dose of Buspar. You can continue taking Ativan 0.25 mg as needed for anxiety. Keep attending your biweekly grief counseling sessions and try to maintain a regular sleep routine. We will monitor your mental health symptoms and adjust medications as needed.  -INSOMNIA: You are having trouble sleeping, likely due to emotional distress. Continue taking amitriptyline 75 mg for sleep and try to keep a regular sleep schedule.  -HYPERTENSION: Your recent blood pressure reading was high at 140/105, which may be related to stress. Monitor your blood pressure regularly and report any frequent high readings or symptoms. If your high blood pressure persists, we may consider starting you on medication.  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 Once you are settled in your new job, inquire about primary care labs and arrange necessary lab work at the hospital's outpatient center.  INSTRUCTIONS:  Please follow up with us  in one month to review your response to the increased Vraylar dose and to discuss your blood pressure readings. Continue attending your grief counseling sessions and maintain a regular sleep routine. If you experience any new or worsening symptoms,  contact us  immediately.

## 2024-06-02 ENCOUNTER — Encounter: Payer: Self-pay | Admitting: Physician Assistant

## 2024-06-22 ENCOUNTER — Ambulatory Visit: Attending: Physician Assistant

## 2024-06-22 DIAGNOSIS — R600 Localized edema: Secondary | ICD-10-CM | POA: Diagnosis not present

## 2024-06-28 ENCOUNTER — Encounter: Payer: Medicare Other | Admitting: Physician Assistant

## 2024-06-28 ENCOUNTER — Ambulatory Visit: Payer: Self-pay | Admitting: Physician Assistant

## 2024-06-29 ENCOUNTER — Encounter: Payer: Self-pay | Admitting: Physician Assistant

## 2024-06-29 ENCOUNTER — Ambulatory Visit: Payer: Self-pay

## 2024-06-29 ENCOUNTER — Ambulatory Visit (INDEPENDENT_AMBULATORY_CARE_PROVIDER_SITE_OTHER): Admitting: Physician Assistant

## 2024-06-29 VITALS — BP 158/90 | HR 94 | Temp 97.8°F | Ht 67.0 in | Wt 190.0 lb

## 2024-06-29 DIAGNOSIS — E782 Mixed hyperlipidemia: Secondary | ICD-10-CM | POA: Diagnosis not present

## 2024-06-29 DIAGNOSIS — J3489 Other specified disorders of nose and nasal sinuses: Secondary | ICD-10-CM | POA: Diagnosis not present

## 2024-06-29 DIAGNOSIS — E0500 Thyrotoxicosis with diffuse goiter without thyrotoxic crisis or storm: Secondary | ICD-10-CM

## 2024-06-29 DIAGNOSIS — I10 Essential (primary) hypertension: Secondary | ICD-10-CM | POA: Diagnosis not present

## 2024-06-29 DIAGNOSIS — R310 Gross hematuria: Secondary | ICD-10-CM

## 2024-06-29 LAB — POCT URINALYSIS DIP (CLINITEK)
Glucose, UA: 100 mg/dL — AB
Nitrite, UA: POSITIVE — AB
POC PROTEIN,UA: >=300 — AB
Spec Grav, UA: 1.02 (ref 1.010–1.025)
Urobilinogen, UA: 2 U/dL — AB
pH, UA: 5 (ref 5.0–8.0)

## 2024-06-29 LAB — POC COVID19 BINAXNOW: SARS Coronavirus 2 Ag: NEGATIVE

## 2024-06-29 NOTE — Telephone Encounter (Signed)
 FYI Only or Action Required?: FYI only for provider.  Patient was last seen in primary care on 06/01/2024 by Milon Cleaves, PA.  Called Nurse Triage reporting No chief complaint on file..  Symptoms began 24 hours ago.  Interventions attempted: Nothing.  Symptoms are: gradually worsening.  Triage Disposition: See PCP When Office is Open (Within 3 Days)  Patient/caregiver understands and will follow disposition?: Yes   Copied from CRM #[id number]. Topic: Clinical - Red Word Triage >> Jun 29, 2024  1:28 PM Emylou G wrote: [facility] that prompted transfer to Nurse Triage: sinus infection.. draining.. nausea.. coming down throat..     Reason for Disposition  [1] Sinus congestion (pressure, fullness) AND [2] present > 10 days  Answer Assessment - Initial Assessment Questions 1. ONSET: When did the nasal discharge start?      X 24 hours 2. AMOUNT: How much discharge is there?      Yellowish- not alot 3. COUGH: Do you have a cough? If Yes, ask: Describe the color of your mucus. (e.g., clear, white, yellow, green)     yellowish 4. RESPIRATORY DISTRESS: Describe your breathing.      no 5. FEVER: Do you have a fever? If Yes, ask: What is your temperature, how was it measured, and when did it start?     no 6. SEVERITY: Overall, how bad are you feeling right now? (e.g., doesn't interfere with normal activities, staying home from school/work, staying in bed)      Overall not feeling well 7. OTHER SYMPTOMS: Do you have any other symptoms? (e.g., earache, mouth sores, sore throat, wheezing)     Drainage down back of throat, nausea 8. PREGNANCY: Is there any chance you are pregnant? When was your last menstrual period?     Na  Pt thinks this could be related to his sinuses/sinus infection.  Protocols used: Common Cold-A-AH

## 2024-06-29 NOTE — Progress Notes (Signed)
 h  Acute Office Visit  Subjective:    Patient ID: [name] Huff, male    DOB: 06-18-1946, 78 y.o.   MRN: [id number]  Chief Complaint  Patient presents with   Drainage/congestion    HPI: Patient is in today for Sinus congestion and blood in his urine.   Discussed the use of AI scribe software for clinical note transcription with the patient, who gave verbal consent to proceed.  History of Present Illness   [name] Huff is a 78 year old male with hypertension and thyroid  issues who presents with sinus congestion and hematuria.  He has experienced worsening sinus congestion and drainage over the past week. Despite taking Allegra, Benadryl , aspirin, and Tylenol, his symptoms have not improved. He has no fever, sore throat, or significant cough, but experiences nausea, especially when turning, due to drainage running down his throat, causing gagging. He previously found relief with a Z-Pak and prednisone , but symptoms recurred after two weeks.  He noticed blood in his urine this morning after lifting a heavy watermelon the previous day. The urine was dark, with blood appearing more towards the end of urination. He has not experienced any flank pain, which he has had in the past with kidney stones, the last occurrence being eight to ten years ago.  He has a history of hypertension and noted a blood pressure reading of 186/112 mmHg last night, which he managed with a 20 mg dose of lisinopril , bringing it down to 158/90. He has experienced a high heart rate and blood pressure spikes, particularly at night.  He has a history of thyroid  issues and is currently taking levothyroxine, 10 mg four times a week and 5 mg three days a week. His thyroid  levels have been slightly off, and his dosage was recently increased by 1 mg per day. He is also taking methimazole , 10 mg four times a week and 5 mg three days a week.  He has a history of arthritis, which he suspects may be causing intermittent  pain in his shoulder. The pain is not constant and is not accompanied by numbness or tingling in his hands. He has used an ice pack and a heating pad to alleviate the pain, noting that the heating pad worsens the pain while the ice pack provides relief.       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     Past Surgical History:  Procedure Laterality Date   CATARACT EXTRACTION     TONSILECTOMY/ADENOIDECTOMY WITH MYRINGOTOMY  2020    Family History  Problem Relation Age of Onset   Thyroid  disease Sister    Goiter Paternal Grandmother    Heart attack Mother    Colon cancer Father        Patient passed away unknown    Social History   Socioeconomic History   Marital status: Legally Separated    Spouse name: Not on file   Number of children: 0   Years of education: Not on file   Highest education level: Not on file  Occupational History   Occupation: Leisure centre manager    Employer: [employer]   Occupation: Retired  Tobacco Use   Smoking status: Never   Smokeless tobacco: Never  Substance and Sexual Activity   Alcohol use: Never  Drug use: Never   Sexual activity: Yes    Partners: Female  Other Topics Concern   Not on file  Social History Narrative   Not on file   Social Drivers of Health   Financial Resource Strain: Low Risk  (06/25/2023)   Overall Financial Resource Strain (CARDIA)    Difficulty of Paying Living Expenses: Not hard at all  Food Insecurity: No Food Insecurity (06/25/2023)   Hunger Vital Sign    Worried About Running Out of Food in the Last Year: Never true    Ran Out of Food in the Last Year: Never true  Transportation Needs: No Transportation Needs (06/25/2023)   PRAPARE - Administrator, Civil Service (Medical): No    Lack of Transportation  (Non-Medical): No  Physical Activity: Inactive (06/25/2023)   Exercise Vital Sign    Days of Exercise per Week: 0 days    Minutes of Exercise per Session: 0 min  Stress: No Stress Concern Present (06/25/2023)   [employer] of Occupational Health - Occupational Stress Questionnaire    Feeling of Stress : Not at all  Social Connections: Socially Isolated (06/25/2023)   Social Connection and Isolation Panel    Frequency of Communication with Friends and Family: More than three times a week    Frequency of Social Gatherings with Friends and Family: More than three times a week    Attends Religious Services: Never    Database administrator or Organizations: No    Attends Banker Meetings: Never    Marital Status: Divorced  Catering manager Violence: Not At Risk (06/25/2023)   Humiliation, Afraid, Rape, and Kick questionnaire    Fear of Current or Ex-Partner: No    Emotionally Abused: No    Physically Abused: No    Sexually Abused: No    Outpatient Medications Prior to Visit  Medication Sig Dispense Refill   fexofenadine (ALLEGRA) 180 MG tablet Take 180 mg by mouth daily.     fluticasone  (FLONASE ) 50 MCG/ACT nasal spray Place 2 sprays into both nostrils daily. 16 g 6   lisinopril  (ZESTRIL ) 10 MG tablet Take 1 tablet (10 mg total) by mouth daily. 90 tablet 3   methimazole  (TAPAZOLE ) 5 MG tablet Take 1 tablet (5 mg total) by mouth as directed. 1 tablet Monday through Wednesday  , 2 tabs Thursday, Friday, Saturday and Sundays 143 tablet 2   omeprazole  (PRILOSEC) 40 MG capsule TAKE 1 CAPSULE BY MOUTH EVERY DAY 90 capsule 1   azithromycin  (ZITHROMAX ) 250 MG tablet 2 DAILY FOR FIRST DAY, THEN DECREASE TO ONE DAILY FOR 4 MORE DAYS. 6 tablet 0   clindamycin  (CLEOCIN ) 300 MG capsule Take 1 capsule (300 mg total) by mouth 3 (three) times daily. 21 capsule 0   No facility-administered medications prior to visit.    No Known Allergies  Review of Systems  Constitutional:  Negative  for appetite change, fatigue and fever.  HENT:  Positive for congestion and rhinorrhea. Negative for ear pain, sinus pressure and sore throat.   Respiratory:  Negative for cough, chest tightness, shortness of breath and wheezing.   Cardiovascular:  Negative for chest pain and palpitations.  Gastrointestinal:  Negative for abdominal pain, constipation, diarrhea, nausea and vomiting.  Genitourinary:  Negative for dysuria and hematuria.  Musculoskeletal:  Negative for arthralgias, back pain, joint swelling and myalgias.  Skin:  Negative for rash.  Neurological:  Negative for dizziness, weakness and headaches.  Psychiatric/Behavioral:  Negative for dysphoric mood. The  patient is not nervous/anxious.        Objective:        06/29/2024    3:20 PM 06/01/2024    1:27 PM 04/18/2024   11:35 AM  Vitals with BMI  Height 5' 7 5' 7 5' 7  Weight 190 lbs 193 lbs 6 oz 195 lbs  BMI 29.75 30.28 30.53  Systolic 158 118 879  Diastolic 90 60 80  Pulse 94 64 72    Orthostatic VS for the past 72 hrs (Last 3 readings):  Patient Position BP Location  06/29/24 1520 Sitting Left Arm     Physical Exam Vitals reviewed.  Constitutional:      General: He is in acute distress.  Cardiovascular:     Rate and Rhythm: Normal rate and regular rhythm.     Heart sounds: Normal heart sounds.  Pulmonary:     Effort: Pulmonary effort is normal.     Breath sounds: Normal breath sounds.  Abdominal:     General: Bowel sounds are normal.     Palpations: Abdomen is soft.     Tenderness: There is no abdominal tenderness.  Neurological:     Mental Status: He is alert and oriented to person, place, and time.  Psychiatric:        Mood and Affect: Mood normal.        Behavior: Behavior normal.     Health Maintenance Due  Topic Date Due   Hepatitis C Screening  Never done   DTaP/Tdap/Td (1 - Tdap) Never done   Zoster Vaccines- Shingrix (1 of 2) Never done   Pneumococcal Vaccine: 50+ Years (2 of 2 - PCV)  12/23/2012   COVID-19 Vaccine (1 - 2024-25 season) Never done   Medicare Annual Wellness (AWV)  06/24/2024   INFLUENZA VACCINE  06/10/2024    There are no preventive care reminders to display for this patient.   Lab Results  Component Value Date   TSH 0.24 (L) 04/18/2024   Lab Results  Component Value Date   WBC 8.7 03/27/2023   HGB 17.0 03/27/2023   HCT 50.6 03/27/2023   MCV 80 03/27/2023   PLT 287 03/27/2023   Lab Results  Component Value Date   NA 138 03/27/2023   K 4.5 03/27/2023   CO2 21 03/27/2023   GLUCOSE 91 03/27/2023   BUN 9 03/27/2023   CREATININE 1.05 03/27/2023   BILITOT 0.5 03/27/2023   ALKPHOS 102 03/27/2023   AST 17 03/27/2023   ALT 16 03/27/2023   PROT 7.0 03/27/2023   ALBUMIN 4.2 03/27/2023   CALCIUM 9.5 03/27/2023   EGFR 73 03/27/2023   GFR 73.79 11/10/2019   Lab Results  Component Value Date   CHOL 178 03/27/2023   Lab Results  Component Value Date   HDL 45 03/27/2023   Lab Results  Component Value Date   LDLCALC 109 (H) 03/27/2023   Lab Results  Component Value Date   TRIG 133 03/27/2023   Lab Results  Component Value Date   CHOLHDL 4.0 03/27/2023   No results found for: HGBA1C Total time spent on today's visit was 40 minutes, including both face-to-face time and nonface-to-face time personally spent on review of chart (labs and imaging), discussing labs and goals, discussing further work-up, treatment options, referrals to specialist if needed, reviewing outside records of pertinent, answering patient's questions, and coordinating care.     Assessment & Plan:  There are no diagnoses linked to this encounter.  Assessment and Plan  Acute kidney failure with gross hematuria Acute onset of gross hematuria with lab findings suggestive of acute kidney failure. Differential includes UTI, kidney infection, or kidney stones, but presentation aligns with kidney failure. - Send to emergency department for further evaluation and  management. - Notify emergency department of condition and lab findings. - Order stat kidney function tests at [facility] outpatient center.  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 - Order stat thyroid  function tests at [facility] outpatient center.  Hypertension Blood pressure elevated at 158/90, managed with lisinopril . Recent spike to 186/112 possibly related to thyroid  dysfunction. - Consider adjusting lisinopril  dosage based on blood pressure readings.  Sinus congestion Persistent sinus congestion with drainage, unresponsive to current medications. - Consider referral to ENT for further evaluation.  Nausea Intermittent nausea possibly related to post-nasal drip, with no recent medication changes. - Provide antiemetic for nausea management.  Recording duration: 41 minutes       No orders of the defined types were placed in this encounter.   No orders of the defined types were placed in this encounter.    Follow-up: No follow-ups on file.  An After Visit Summary was printed and given to the patient.    I,Lauren M Auman,acting as a Neurosurgeon for [employer], [employer].,have documented all relevant documentation on the behalf of [name], PA,as directed by  [name], PA while in the presence of [name], [place].    [name], [place] [facility] [phone]

## 2024-06-30 ENCOUNTER — Encounter: Admitting: Physician Assistant

## 2024-07-03 ENCOUNTER — Encounter (HOSPITAL_COMMUNITY): Payer: Self-pay

## 2024-07-03 ENCOUNTER — Inpatient Hospital Stay: Admit: 2024-07-03 | Admitting: Internal Medicine

## 2024-07-04 DIAGNOSIS — R972 Elevated prostate specific antigen [PSA]: Secondary | ICD-10-CM | POA: Insufficient documentation

## 2024-07-16 DIAGNOSIS — D630 Anemia in neoplastic disease: Secondary | ICD-10-CM | POA: Insufficient documentation

## 2024-07-16 DIAGNOSIS — R600 Localized edema: Secondary | ICD-10-CM | POA: Insufficient documentation

## 2024-07-16 DIAGNOSIS — C61 Malignant neoplasm of prostate: Secondary | ICD-10-CM | POA: Insufficient documentation

## 2024-07-16 DIAGNOSIS — M6281 Muscle weakness (generalized): Secondary | ICD-10-CM | POA: Insufficient documentation

## 2024-07-16 DIAGNOSIS — N19 Unspecified kidney failure: Secondary | ICD-10-CM | POA: Insufficient documentation

## 2024-07-18 DIAGNOSIS — Z7409 Other reduced mobility: Secondary | ICD-10-CM | POA: Insufficient documentation

## 2024-08-01 ENCOUNTER — Telehealth: Payer: Self-pay

## 2024-08-01 NOTE — Telephone Encounter (Signed)
 Spoke with Sharlet perks the below orders.   Copied from CRM [id number]. Topic: Clinical - Home Health Verbal Orders >> Aug 01, 2024  2:39 PM Montie POUR wrote: Caller/Agency: [facility] - Sharlet Rushing Number: [phone] She has a secured voicemail Service Requested: Skilled Nursing Frequency: 1 time weekly for 4 weeks; 2 visits during following month; 2 visits as needed Any new concerns about the patient? No

## 2024-08-03 ENCOUNTER — Ambulatory Visit: Payer: Self-pay | Admitting: Physician Assistant

## 2024-08-03 ENCOUNTER — Encounter: Payer: Self-pay | Admitting: Physician Assistant

## 2024-08-03 ENCOUNTER — Ambulatory Visit (INDEPENDENT_AMBULATORY_CARE_PROVIDER_SITE_OTHER): Admitting: Physician Assistant

## 2024-08-03 VITALS — BP 138/62 | HR 94 | Temp 97.4°F | Ht 67.0 in | Wt 174.8 lb

## 2024-08-03 DIAGNOSIS — N3001 Acute cystitis with hematuria: Secondary | ICD-10-CM

## 2024-08-03 DIAGNOSIS — C61 Malignant neoplasm of prostate: Secondary | ICD-10-CM | POA: Diagnosis not present

## 2024-08-03 DIAGNOSIS — R310 Gross hematuria: Secondary | ICD-10-CM | POA: Insufficient documentation

## 2024-08-03 DIAGNOSIS — R600 Localized edema: Secondary | ICD-10-CM

## 2024-08-03 DIAGNOSIS — I10 Essential (primary) hypertension: Secondary | ICD-10-CM | POA: Diagnosis not present

## 2024-08-03 LAB — POCT URINALYSIS DIP (CLINITEK)
Bilirubin, UA: NEGATIVE
Glucose, UA: NEGATIVE mg/dL
Ketones, POC UA: NEGATIVE mg/dL
Nitrite, UA: NEGATIVE
POC PROTEIN,UA: NEGATIVE
Spec Grav, UA: 1.015 (ref 1.010–1.025)
Urobilinogen, UA: NEGATIVE U/dL
pH, UA: 6 (ref 5.0–8.0)

## 2024-08-03 NOTE — Assessment & Plan Note (Signed)
 Controlled Denies any new or worse symptoms Continue to monitor Continue to follow up with Dr.Chao on Monday Orders:   POCT URINALYSIS DIP (CLINITEK)

## 2024-08-03 NOTE — Assessment & Plan Note (Signed)
 Lower extremity edema Persistent edema, improved but present, especially in left leg. No DVT signs. Managed with Lasix  and protein drinks. - Continue Lasix  20 mg twice daily. - Encourage use of compression socks. - Advise to keep moving and walking.

## 2024-08-03 NOTE — Patient Instructions (Addendum)
 Monitor BP. If BP is greater than 140/90 with two separate readings begin taking 10mg  Lisinopril . I would also begin taking Lisinopril  10mg  again if your blood pressure is above 140/90 and you begin to have headaches, blurry vision, or chest pain. If you begin to have any of these symptoms please let us  know.     VISIT SUMMARY: You had a follow-up appointment today to discuss your recent hospitalization and diagnosis of prostate cancer. We reviewed your current medications, symptoms, and overall health status. You are scheduled to see a urologist for further evaluation and management of your prostate cancer. We also discussed your leg swelling, blood pressure management, and general health maintenance.  YOUR PLAN: -PROSTATE CANCER: Prostate cancer is a type of cancer that occurs in the prostate gland. Your biopsy confirmed that the cancer is localized and has not spread. You will see Dr. Meredeth, a urologist, to discuss further imaging options like MRI or PET scan and treatment options such as prostate removal or continuing with oral medication and radiation.  -LOWER EXTREMITY EDEMA: Lower extremity edema is swelling in the legs. Your leg swelling has improved but is still present, especially in your left leg. Continue taking Lasix  20 mg twice daily, use compression socks, and keep moving and walking to help reduce the swelling.  -HYPERTENSION: Hypertension is high blood pressure. Your blood pressure is currently borderline and has improved. You should hold off on taking your blood pressure medication unless your readings exceed 140/90 mmHg. If your blood pressure goes above this level, resume the medication starting with a 10 mg dose. Monitor for symptoms like headaches or blurred vision.  INSTRUCTIONS: Please follow up with Dr. Meredeth for further evaluation and management of your prostate cancer. Continue monitoring your blood pressure regularly and resume medication if it exceeds 140/90 mmHg. Keep  using compression socks and stay active to manage your leg swelling.

## 2024-08-03 NOTE — Assessment & Plan Note (Signed)
 Hypertension Hypertension managed with fluid pills. Blood pressure borderline, improved. Held medication due to low readings. - Hold medication unless readings exceed 140/90 mmHg. - If blood pressure exceeds 140/90 mmHg, resume medication starting with 10 mg dose. - Monitor for symptoms such as headaches or blurred vision.

## 2024-08-03 NOTE — Progress Notes (Signed)
 Subjective:  Patient ID: [name] Huff, male    DOB: 1946-02-07  Age: 78 y.o. MRN: [id number]  Chief Complaint  Patient presents with   Rehab follow up    HPI: Discussed the use of AI scribe software for clinical note transcription with the patient, who gave verbal consent to proceed.  History of Present Illness [name] Huff is a 78 year old male with prostate cancer who presents for a hospital follow-up.  He was recently hospitalized and diagnosed with prostate cancer, with biopsies performed during his hospital stay. He is scheduled to see a urologist for further evaluation and management.  He is currently on an oral medication for prostate cancer and takes a protein drink twice daily to help reduce swelling. He has experienced significant improvement in leg swelling, although some swelling and soreness persist, particularly in the left leg. The soreness is described as 'touchy' with some itching. He participates in physical therapy, focusing on walking and balance exercises, and uses compression socks, although he did not wear them to the appointment.  No hematuria since leaving the hospital, and he provided a urine sample during the visit. He was unsure of the exact amount of blood transfused during his hospital stay.  He is taking Lasix  20 mg, one tablet in the morning and one at night, to manage fluid retention. He consumes Powerade to maintain electrolyte balance. He has not taken his blood pressure medication for two days due to concerns about hypotension but monitors his blood pressure regularly.  He often feels sleepy, attributing it to a lack of sleep during his hospital stay. He is no longer taking sleeping pills and sleeps well at night, waking only to use the bathroom. His thyroid  medication was not administered initially during his hospital stay, but he resumed it after a few days.  He recalls undergoing various diagnostic tests during his hospital stay, including an  ultrasound of his legs and possibly an electrocardiogram. He is unsure about all the tests performed due to being in and out of consciousness.  He reports no pain with walking, but describes a 'sharp stabbing pain' when pressure is applied to the leg. No dizziness and his urine has lightened in color with no blood present.         08/03/2024    9:41 AM 06/25/2023   11:21 AM 03/27/2023    8:15 AM 11/25/2021    3:31 PM 11/06/2020    9:13 AM  Depression screen PHQ 2/9  Decreased Interest 0 0 0 0 0  Down, Depressed, Hopeless 0 0 0 0 0  PHQ - 2 Score 0 0 0 0 0  Altered sleeping   0    Tired, decreased energy   0    Change in appetite   0    Feeling bad or failure about yourself    0    Trouble concentrating   0    Moving slowly or fidgety/restless   0    Suicidal thoughts   0    PHQ-9 Score   0    Difficult doing work/chores   Not difficult at all          06/25/2023   11:22 AM  Fall Risk   Falls in the past year? 0  Number falls in past yr: 0  Injury with Fall? 0  Risk for fall due to : No Fall Risks  Follow up Falls prevention discussed    Patient Care Team: Milon Cleaves, [place] as  PCP - General (Physician Assistant) Shamleffer, Donell Cardinal, MD as Attending Physician (Endocrinology)   Review of Systems  Constitutional:  Negative for appetite change, fatigue and fever.  HENT:  Negative for congestion, ear pain, sinus pressure and sore throat.   Respiratory:  Negative for cough, chest tightness, shortness of breath and wheezing.   Cardiovascular:  Negative for chest pain and palpitations.  Gastrointestinal:  Negative for abdominal pain, constipation, diarrhea, nausea and vomiting.  Genitourinary:  Negative for dysuria and hematuria.  Musculoskeletal:  Negative for arthralgias, back pain, joint swelling and myalgias.  Skin:  Negative for rash.  Neurological:  Negative for dizziness, weakness and headaches.  Psychiatric/Behavioral:  Negative for dysphoric mood. The patient is  not nervous/anxious.     Current Outpatient Medications on File Prior to Visit  Medication Sig Dispense Refill   bicalutamide (CASODEX) 50 MG tablet Take 50 mg by mouth daily.     Colloidal Oatmeal (EUCERIN ECZEMA RELIEF) 1 % CREA Apply topically.     fexofenadine (ALLEGRA) 180 MG tablet Take 180 mg by mouth daily.     finasteride (PROSCAR) 5 MG tablet Take 5 mg by mouth daily.     furosemide  (LASIX ) 20 MG tablet Take 20 mg by mouth 2 (two) times daily.     methimazole  (TAPAZOLE ) 5 MG tablet Take 1 tablet (5 mg total) by mouth as directed. 1 tablet Monday through Wednesday  , 2 tabs Thursday, Friday, Saturday and Sundays 143 tablet 2   omeprazole  (PRILOSEC) 40 MG capsule TAKE 1 CAPSULE BY MOUTH EVERY DAY 90 capsule 1   polyethylene glycol powder (GLYCOLAX/MIRALAX) 17 GM/SCOOP powder Take 17 g by mouth 2 (two) times daily.     fluticasone  (FLONASE ) 50 MCG/ACT nasal spray Place 2 sprays into both nostrils daily. (Patient not taking: Reported on 08/03/2024) 16 g 6   lisinopril  (ZESTRIL ) 10 MG tablet Take 1 tablet (10 mg total) by mouth daily. (Patient not taking: Reported on 08/03/2024) 90 tablet 3   No current facility-administered medications on file prior to visit.   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    Past Surgical History:  Procedure Laterality Date   CATARACT EXTRACTION     TONSILECTOMY/ADENOIDECTOMY WITH MYRINGOTOMY  2020    Family History  Problem Relation Age of Onset   Thyroid  disease Sister    Goiter Paternal Grandmother    Heart attack Mother    Colon cancer Father        Patient passed away unknown   Social History   Socioeconomic History   Marital status: Legally Separated    Spouse name: Not on file   Number of children: 0   Years of education: Not on file   Highest education  level: Not on file  Occupational History   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[employer]   Occupation: Retired  Tobacco Use   Smoking status: Never   Smokeless tobacco: Never  Substance and Sexual Activity   Alcohol use: Never   Drug use: Never   Sexual activity: Yes    Partners: Female  Other Topics Concern   Not on file  Social History Narrative   Not on file   Social Drivers of Health   Financial Resource Strain: Low Risk  (06/25/2023)   Overall  Financial Resource Strain (CARDIA)    Difficulty of Paying Living Expenses: Not hard at all  Food Insecurity: Low Risk  (07/04/2024)   Received from [facility]   Hunger Vital Sign    Within the past 12 months, you worried that your food would run out before you got money to buy more: Never true    Within the past 12 months, the food you bought just didn't last and you didn't have money to get more. : Never true  Transportation Needs: No Transportation Needs (07/04/2024)   Received from [facility]    In the past 12 months, has lack of reliable transportation kept you from medical appointments, meetings, work or from getting things needed for daily living? : No  Physical Activity: Inactive (06/25/2023)   Exercise Vital Sign    Days of Exercise per Week: 0 days    Minutes of Exercise per Session: 0 min  Stress: No Stress Concern Present (06/25/2023)   [employer] of Occupational Health - Occupational Stress Questionnaire    Feeling of Stress : Not at all  Social Connections: Socially Isolated (06/25/2023)   Social Connection and Isolation Panel    Frequency of Communication with Friends and Family: More than three times a week    Frequency of Social Gatherings with Friends and Family: More than three times a week    Attends Religious Services: Never    Database administrator or Organizations: No    Attends Engineer, structural: Never    Marital Status: Divorced    Objective:   BP 138/62 (BP Location: Left Arm, Patient Position: Sitting)   Pulse 94   Temp (!) 97.4 F (36.3 C) (Temporal)   Ht 5' 7 (1.702 m)   Wt 174 lb 12.8 oz (79.3 kg)   SpO2 98%   BMI 27.38 kg/m      08/03/2024    9:37 AM 06/29/2024    3:20 PM 06/01/2024    1:27 PM  BP/Weight  Systolic BP 138 158 118  Diastolic BP 62 90 60  Wt. (Lbs) 174.8 190 193.4  BMI 27.38 kg/m2 29.76 kg/m2 30.29 kg/m2    Physical Exam Vitals reviewed.  Constitutional:      Appearance: Normal appearance.  Cardiovascular:     Rate and Rhythm: Normal rate and regular rhythm.     Heart sounds: Normal heart sounds. No murmur heard.    No systolic murmur is present.  Pulmonary:     Effort: Pulmonary effort is normal.     Breath sounds: Normal breath sounds.  Abdominal:     General: Bowel sounds are normal.     Palpations: Abdomen is soft.     Tenderness: There is no abdominal tenderness.  Musculoskeletal:     Right lower leg: 2+ Pitting Edema present.     Left lower leg: 2+ Pitting Edema present.  Neurological:     Mental Status: He is alert and oriented to person, place, and time.  Psychiatric:        Mood and Affect: Mood normal.        Behavior: Behavior normal.         Lab Results  Component Value Date   WBC 8.7 03/27/2023   HGB 17.0 03/27/2023   HCT 50.6 03/27/2023   PLT 287 03/27/2023   GLUCOSE 91 03/27/2023   CHOL 178 03/27/2023   TRIG 133 03/27/2023   HDL 45 03/27/2023   LDLCALC 109 (H) 03/27/2023   ALT  16 03/27/2023   AST 17 03/27/2023   NA 138 03/27/2023   K 4.5 03/27/2023   CL 101 03/27/2023   CREATININE 1.05 03/27/2023   BUN 9 03/27/2023   CO2 21 03/27/2023   TSH 0.24 (L) 04/18/2024    Results for orders placed or performed in visit on 06/29/24  POC COVID-19 [employer]Now   Collection Time: 06/29/24  3:50 PM  Result Value Ref Range   SARS Coronavirus 2 Ag Negative Negative  POCT URINALYSIS DIP (CLINITEK)   Collection Time: 06/29/24  4:16 PM  Result Value Ref Range    Color, UA     Clarity, UA     Glucose, UA =100 (A) negative mg/dL   Bilirubin, UA small (A) negative   Ketones, POC UA moderate (40) (A) negative mg/dL   Spec Grav, UA 8.979 8.989 - 1.025   Blood, UA large (A) negative   pH, UA 5.0 5.0 - 8.0   POC PROTEIN,UA >=300 (A) negative, trace   Urobilinogen, UA 2.0 (A) 0.2 or 1.0 E.U./dL   Nitrite, UA Positive (A) Negative   Leukocytes, UA Large (3+) (A) Negative  .  Assessment & Plan:   Assessment & Plan Gross hematuria Controlled Denies any new or worse symptoms Continue to monitor Continue to follow up with Dr.Chao on Monday Orders:   POCT URINALYSIS DIP (CLINITEK)  Acute cystitis with hematuria UA sample taken today showing 1+ WBC Will send for culture to rule out UTI Denies any current symptoms Continue to monitor. Orders:   Urine Culture  Prostate cancer [facility]) Prostate cancer Prostate cancer confirmed by biopsy, localized with unkown metastasis. Awaiting further imaging. - Refer to Dr. Meredeth for evaluation and management. - Discuss imaging options with Dr. Meredeth, including MRI or PET scan. - Discuss treatment options with Dr. Meredeth, including prostate removal or oral medication with radiation.    Peripheral edema Lower extremity edema Persistent edema, improved but present, especially in left leg. No DVT signs. Managed with Lasix  and protein drinks. - Continue Lasix  20 mg twice daily. - Encourage use of compression socks. - Advise to keep moving and walking.    Primary hypertension Hypertension Hypertension managed with fluid pills. Blood pressure borderline, improved. Held medication due to low readings. - Hold medication unless readings exceed 140/90 mmHg. - If blood pressure exceeds 140/90 mmHg, resume medication starting with 10 mg dose. - Monitor for symptoms such as headaches or blurred vision.      Body mass index is 27.38 kg/m.     No orders of the defined types were placed in this encounter.   No  orders of the defined types were placed in this encounter.      Follow-up: No follow-ups on file.  An After Visit Summary was printed and given to the patient.   I,Lauren M Auman,acting as a Neurosurgeon for [employer], [employer].,have documented all relevant documentation on the behalf of [name], PA,as directed by  [name], PA while in the presence of [name], [place].    [name], [place] [facility] [phone]

## 2024-08-03 NOTE — Assessment & Plan Note (Signed)
 Prostate cancer Prostate cancer confirmed by biopsy, localized with unkown metastasis. Awaiting further imaging. - Refer to Dr. Meredeth for evaluation and management. - Discuss imaging options with Dr. Meredeth, including MRI or PET scan. - Discuss treatment options with Dr. Meredeth, including prostate removal or oral medication with radiation.

## 2024-08-05 LAB — URINE CULTURE

## 2024-08-11 ENCOUNTER — Ambulatory Visit: Payer: Self-pay

## 2024-08-11 ENCOUNTER — Telehealth: Payer: Self-pay

## 2024-08-11 ENCOUNTER — Other Ambulatory Visit: Payer: Self-pay | Admitting: Physician Assistant

## 2024-08-11 DIAGNOSIS — L209 Atopic dermatitis, unspecified: Secondary | ICD-10-CM

## 2024-08-11 MED ORDER — TRIAMCINOLONE ACETONIDE 0.5 % EX OINT: 1.0000 | TOPICAL_OINTMENT | Freq: Two times a day (BID) | CUTANEOUS | 0 refills | Status: AC

## 2024-08-11 NOTE — Telephone Encounter (Signed)
 error

## 2024-08-11 NOTE — Telephone Encounter (Signed)
 Patient called in asking if he could stop lasix  due to fluid retention in legs has decreased considerably. Per Nola... yes he may stop Lasix .  Patient also asked for something to be sent in for eczema flare up. Per Nola... Will send triamcinolone  ointment to pharmacy on file.

## 2024-08-11 NOTE — Telephone Encounter (Signed)
    Copied from CRM #[id number]. Topic: Clinical - Medical Advice >> Aug 11, 2024 11:34 AM Amy B wrote: Reason for CRM:  Patient states he is experiencing itching and would like a prescription of Atarax.  He also would like to know if he needs to continue taking Lasix .  He requests a call back [phone]

## 2024-08-11 NOTE — Telephone Encounter (Signed)
 FYI Only or Action Required?: Action required by provider: Med request.  Patient was last seen in primary care on 08/03/2024 by Milon Cleaves, PA.  Called Nurse Triage reporting Pruritis.  Symptoms began several weeks ago.  Interventions attempted: Nothing.  Symptoms are: stable.  Triage Disposition: See PCP Within 2 Weeks  Patient/caregiver understands and will follow disposition?: Yes Reason for Disposition  Itching is a chronic symptom (recurrent or ongoing AND present > 4 weeks)  Answer Assessment - Initial Assessment Questions Patient stated had eczema as a child and think its this flaring back up again. Started happening in the hospital and was not given anything for it. Patient is requesting Atarax sent to [facility] on file. And would also like to know if he needs to continue taking Lasix . Please advise. Patient requesting call back , said ok to leave message he is leaving to go to [place].  [phone]  1. DESCRIPTION: Describe the itching you are having.     Just itchy and scratching  2. SEVERITY: How bad is it?      Moderate  3. SCRATCHING: Are there any scratch marks? Bleeding?     No  4. ONSET: When did this begin? (e.g., minutes, hours, days ago)      Started in hospital at end of August  5. CAUSE: What do you think is causing the itching? (ask about swimming pools, pollen, animals, soaps, etc.)     Unsure, going through cancer treatments for prostate  6. OTHER SYMPTOMS: Do you have any other symptoms? (e.g., fever, rash)     No  Protocols used: Itching - [facility] [place]

## 2024-08-12 ENCOUNTER — Inpatient Hospital Stay: Admitting: Oncology

## 2024-08-12 ENCOUNTER — Inpatient Hospital Stay

## 2024-08-12 ENCOUNTER — Other Ambulatory Visit: Payer: Self-pay | Admitting: Oncology

## 2024-08-12 DIAGNOSIS — C61 Malignant neoplasm of prostate: Secondary | ICD-10-CM

## 2024-08-12 NOTE — Progress Notes (Unsigned)
 [facility] at [facility] [address] [place],  [place]  [address] [phone]  Clinic Day:  08/12/2024  Referring physician: No ref. provider found   HISTORY OF PRESENT ILLNESS:  The patient is a 78 y.o. male  *** who I was asked to consult upon for ***   PAST MEDICAL HISTORY:   Past Medical History:  Diagnosis Date  . Asthma   . GERD (gastroesophageal reflux disease)   . Hyperlipidemia   . Hypertension   . Skin cancer 2024  . 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 CATARACT EXTRACTION    . TONSILECTOMY/ADENOIDECTOMY WITH MYRINGOTOMY  2020    CURRENT MEDICATIONS:   Current Outpatient Medications  Medication Sig Dispense Refill  . bicalutamide (CASODEX) 50 MG tablet Take 50 mg by mouth daily.    . Colloidal Oatmeal (EUCERIN ECZEMA RELIEF) 1 % CREA Apply topically.    . fexofenadine (ALLEGRA) 180 MG tablet Take 180 mg by mouth daily.    . finasteride (PROSCAR) 5 MG tablet Take 5 mg by mouth daily.    . fluticasone  (FLONASE ) 50 MCG/ACT nasal spray Place 2 sprays into both nostrils daily. (Patient not taking: Reported on 08/03/2024) 16 g 6  . furosemide  (LASIX ) 20 MG tablet Take 20 mg by mouth 2 (two) times daily.    . lisinopril  (ZESTRIL ) 10 MG tablet Take 1 tablet (10 mg total) by mouth daily. (Patient not taking: Reported on 08/03/2024) 90 tablet 3  . methimazole  (TAPAZOLE ) 5 MG tablet Take 1 tablet (5 mg total) by mouth as directed. 1 tablet Monday through Wednesday  , 2 tabs Thursday, Friday, Saturday and Sundays 143 tablet 2  . omeprazole  (PRILOSEC) 40 MG capsule TAKE 1 CAPSULE BY MOUTH EVERY DAY 90 capsule 1  . polyethylene glycol powder (GLYCOLAX/MIRALAX) 17 GM/SCOOP powder Take 17 g by mouth 2 (two) times daily.    . triamcinolone  ointment (KENALOG ) 0.5 % Apply 1 Application topically 2 (two) times daily. 30 g 0   No current  facility-administered medications for this visit.    ALLERGIES:  No Known Allergies  FAMILY HISTORY:   Family History  Problem Relation Age of Onset  . Thyroid  disease Sister   . Goiter Paternal Grandmother   . Heart attack Mother   . Colon cancer Father        Patient passed away unknown    SOCIAL HISTORY:   reports that he has never smoked. He has never used smokeless tobacco. He reports that he does not drink alcohol and does not use drugs.  REVIEW OF SYSTEMS:  Review of Systems - Oncology   PHYSICAL EXAM:  There were no vitals taken for this visit. Wt Readings from Last 3 Encounters:  08/03/24 174 lb 12.8 oz (79.3 kg)  06/29/24 190 lb (86.2 kg)  06/01/24 193 lb 6.4 oz (87.7 kg)   There is no height or weight on file to calculate BMI. Performance status (ECOG): {CHL ONC [id number] Physical Exam  LABS:      Latest Ref Rng & Units 03/27/2023    8:44 AM 05/15/2021    1:57 PM 06/11/2020    7:56 AM  CBC  WBC 3.4 - 10.8 x10E3/uL 8.7  8.8  8.6   Hemoglobin 13.0 - 17.7 g/dL 82.9  83.6  83.6   Hematocrit 37.5 - 51.0 % 50.6  48.2  49.4   Platelets  150 - 450 x10E3/uL 287  281  249       Latest Ref Rng & Units 03/27/2023    8:44 AM 05/15/2021    1:57 PM 06/11/2020    7:56 AM  CMP  Glucose 70 - 99 mg/dL 91  94  89   BUN 8 - 27 mg/dL 9  11  12    Creatinine 0.76 - 1.27 mg/dL 8.94  9.00  8.98   Sodium 134 - 144 mmol/L 138  133  137   Potassium 3.5 - 5.2 mmol/L 4.5  4.2  4.6   Chloride 96 - 106 mmol/L 101  97  98   CO2 20 - 29 mmol/L 21  22  23    Calcium 8.6 - 10.2 mg/dL 9.5  9.5  9.6   Total Protein 6.0 - 8.5 g/dL 7.0  7.4  7.1   Total Bilirubin 0.0 - 1.2 mg/dL 0.5  0.4  0.6   Alkaline Phos 44 - 121 IU/L 102  105  107   AST 0 - 40 IU/L 17  17  16    ALT 0 - 44 IU/L 16  18  15       No results found for: CEA1, CEA / No results found for: CEA1, CEA No results found for: PSA1 No results found for: CAN199 No results found for: CAN125  No results found  for: TOTALPROTELP, ALBUMINELP, A1GS, A2GS, BETS, BETA2SER, GAMS, MSPIKE, SPEI No results found for: TIBC, FERRITIN, IRONPCTSAT No results found for: LDH  No results found for: AFPTUMOR, TOTALPROTELP, ALBUMINELP, A1GS, A2GS, BETS, BETA2SER, GAMS, MSPIKE, SPEI, LDH, CEA1, CEA, PSA1, IGASERUM, IGGSERUM, IGMSERUM, THGAB, THYROGLB  Review Flowsheet        No data to display          STUDIES:  No results found.   ASSESSMENT & PLAN:  A 79 y.o. male who I was asked to consult upon for *** .The patient understands all the plans discussed today and is in agreement with them.  I do appreciate No ref. provider found for his new consult.   Rieley Hausman DELENA Kerns, MD

## 2024-08-12 NOTE — Progress Notes (Deleted)
 [facility] [address] [place],  [place]  [address] [phone]  Clinic Day:  08/12/2024  Referring physician: Milon Cleaves, PA   HISTORY OF PRESENT ILLNESS:  The patient is a 78 y.o. male  *** who I was asked to consult upon for metastatic prostate cancer.  The patient began having gross hematuria. A recent PSA was extremely elevated  at 2,983.    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     Colloidal Oatmeal (EUCERIN ECZEMA RELIEF) 1 % CREA Apply topically.     fexofenadine (ALLEGRA) 180 MG tablet Take 180 mg by mouth daily.     finasteride (PROSCAR) 5 MG tablet Take 5 mg by mouth daily.     fluticasone  (FLONASE ) 50 MCG/ACT nasal spray Place 2 sprays into both nostrils daily. (Patient not taking: Reported on 08/03/2024) 16 g 6   furosemide  (LASIX ) 20 MG tablet Take 20 mg by mouth 2 (two) times daily.     lisinopril  (ZESTRIL ) 10 MG tablet Take 1 tablet (10 mg total) by mouth daily. (Patient not taking: Reported on 08/03/2024) 90 tablet 3   methimazole  (TAPAZOLE ) 5 MG tablet Take 1 tablet (5 mg total) by mouth as directed. 1 tablet Monday through Wednesday  , 2 tabs Thursday, Friday, Saturday and Sundays 143 tablet 2   omeprazole  (PRILOSEC) 40 MG capsule TAKE 1 CAPSULE BY MOUTH EVERY DAY 90 capsule 1   polyethylene glycol powder (GLYCOLAX/MIRALAX) 17 GM/SCOOP powder Take 17 g by mouth 2 (two) times daily.     triamcinolone  ointment  (KENALOG ) 0.5 % Apply 1 Application topically 2 (two) times daily. 30 g 0   No current facility-administered medications for this visit.    ALLERGIES:  No Known Allergies  FAMILY HISTORY:   Family History  Problem Relation Age of Onset   Thyroid  disease Sister    Goiter Paternal Grandmother    Heart attack Mother    Colon cancer Father        Patient passed away unknown    SOCIAL HISTORY:   reports that he has never smoked. He has never used smokeless tobacco. He reports that he does not drink alcohol and does not use drugs.  REVIEW OF SYSTEMS:  Review of Systems - Oncology   PHYSICAL EXAM:  There were no vitals taken for this visit. Wt Readings from Last 3 Encounters:  08/03/24 174 lb 12.8 oz (79.3 kg)  06/29/24 190 lb (86.2 kg)  06/01/24 193 lb 6.4 oz (87.7 kg)   There is no height or weight on file to calculate BMI. Performance status (ECOG): {CHL ONC [id number] Physical Exam  PATHOLOGY:  His TURP pathology revealed the following: A. 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 Percentage of Gleason pattern 4: 60%. Percentage of Gleason pattern 5: 5%.  B. RIGHT PARAMEDIAN APEX,  PROSTATE, NEEDLE CORE BIOPSY: Acinar adenocarcinoma, Gleason 3+4=7 (Grade Group 2), in 1 of 1 core, involving 50% of the core length.  Percentage of Gleason pattern 4: 25%.  C. LEFT PARAMEDIAN BASE, PROSTATE, NEEDLE CORE BIOPSY: Benign prostatic tissue. Negative for malignancy.   D. 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 Percentage of Gleason pattern 4: 90%. Percentage of Gleason pattern 5: 5%.   E. LEFT POSTERIOR APEX, PROSTATE, NEEDLE CORE BIOPSY: Acinar adenocarcinoma, Gleason 3+5=8 (Grade Group 4), in 1 of 1 core, involving 90% of the core length.   Percentage of Gleason pattern 4: 20%. Percentage of Gleason pattern 5: 10%.   F. 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 Percentage of Gleason pattern 4: 80%. Percentage of Gleason pattern 5: 20%. Perineural invasion is present.   G. 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  High grade prostatic intraepithelial neoplasia. Percentage of Gleason pattern 4: 90%. Percentage of Gleason pattern 5: 5%.   H. 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  High grade prostatic intraepithelial neoplasia. Percentage of Gleason pattern 4: 60%. Percentage of Gleason pattern 5: 30%.   I. 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 Percentage of Gleason pattern 4: 40%. Percentage of Gleason pattern 5: 30%.   J. 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 High grade prostatic intraepithelial neoplasia. Percentage of Gleason pattern 4: 80%. Percentage of Gleason pattern 5: 10%.   K. 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 High grade prostatic intraepithelial neoplasia. Percentage of Gleason pattern 4: 70%. Percentage of Gleason pattern 5: 30%.  Perineural invasion is present.  L. RIGHT ANTERIOR, PROSTATE, NEEDLE CORE BIOPSY: Acinar adenocarcinoma, Gleason 3+4=7 (Grade Group 2), in 1 of 1 core, involving 10% of the core  length. Percentage of Gleason pattern 4: 40%.   LABS:      Latest Ref Rng & Units 03/27/2023    8:44 AM 05/15/2021    1:57 PM 06/11/2020    7:56 AM  CBC  WBC 3.4 - 10.8 x10E3/uL 8.7  8.8  8.6   Hemoglobin 13.0 - 17.7 g/dL 82.9  83.6  83.6   Hematocrit 37.5 - 51.0 % 50.6  48.2  49.4   Platelets 150 - 450 x10E3/uL 287  281  249       Latest Ref Rng & Units 03/27/2023    8:44 AM 05/15/2021    1:57 PM 06/11/2020    7:56 AM  CMP  Glucose 70 - 99 mg/dL 91  94  89   BUN 8 - 27 mg/dL 9  11  12    Creatinine 0.76 - 1.27 mg/dL 8.94  9.00  8.98   Sodium 134 - 144 mmol/L 138  133  137   Potassium 3.5 - 5.2 mmol/L 4.5  4.2  4.6  Chloride 96 - 106 mmol/L 101  97  98   CO2 20 - 29 mmol/L 21  22  23    Calcium 8.6 - 10.2 mg/dL 9.5  9.5  9.6   Total Protein 6.0 - 8.5 g/dL 7.0  7.4  7.1   Total Bilirubin 0.0 - 1.2 mg/dL 0.5  0.4  0.6   Alkaline Phos 44 - 121 IU/L 102  105  107   AST 0 - 40 IU/L 17  17  16    ALT 0 - 44 IU/L 16  18  15       No results found for: CEA1, CEA / No results found for: CEA1, CEA No results found for: PSA1 No results found for: CAN199 No results found for: CAN125  No results found for: TOTALPROTELP, ALBUMINELP, A1GS, A2GS, BETS, BETA2SER, GAMS, MSPIKE, SPEI No results found for: TIBC, FERRITIN, IRONPCTSAT No results found for: LDH  No results found for: AFPTUMOR, TOTALPROTELP, ALBUMINELP, A1GS, A2GS, BETS, BETA2SER, GAMS, MSPIKE, SPEI, LDH, CEA1, CEA, PSA1, IGASERUM, IGGSERUM, IGMSERUM, THGAB, THYROGLB  Review Flowsheet        No data to display          STUDIES:  No results found.   ASSESSMENT & PLAN:  A 78 y.o. male who I was asked to consult upon for *** .The patient understands all the plans discussed today and is in agreement with them.  I do appreciate Milon Cleaves, [place] for his new consult.   Shilo Pauwels DELENA Kerns, MD

## 2024-08-29 ENCOUNTER — Telehealth: Payer: Self-pay | Admitting: Physician Assistant

## 2024-08-29 NOTE — Telephone Encounter (Signed)
 [facility] [phone]

## 2024-09-05 ENCOUNTER — Telehealth: Payer: Self-pay | Admitting: Physician Assistant

## 2024-09-05 NOTE — Telephone Encounter (Signed)
 [facility] [phone]

## 2024-09-12 ENCOUNTER — Other Ambulatory Visit (HOSPITAL_BASED_OUTPATIENT_CLINIC_OR_DEPARTMENT_OTHER): Payer: Self-pay | Admitting: Urology

## 2024-09-12 DIAGNOSIS — C61 Malignant neoplasm of prostate: Secondary | ICD-10-CM

## 2024-09-19 ENCOUNTER — Encounter: Payer: Self-pay | Admitting: Internal Medicine

## 2024-09-19 ENCOUNTER — Ambulatory Visit: Admitting: Internal Medicine

## 2024-09-19 ENCOUNTER — Other Ambulatory Visit

## 2024-09-19 VITALS — BP 164/84 | Ht 67.0 in | Wt 175.0 lb

## 2024-09-19 DIAGNOSIS — E041 Nontoxic single thyroid nodule: Secondary | ICD-10-CM | POA: Diagnosis not present

## 2024-09-19 DIAGNOSIS — E0590 Thyrotoxicosis, unspecified without thyrotoxic crisis or storm: Secondary | ICD-10-CM | POA: Diagnosis not present

## 2024-09-19 DIAGNOSIS — E0500 Thyrotoxicosis with diffuse goiter without thyrotoxic crisis or storm: Secondary | ICD-10-CM | POA: Diagnosis not present

## 2024-09-19 LAB — T4, FREE: Free T4: 1 ng/dL (ref 0.8–1.8)

## 2024-09-19 LAB — TSH: TSH: 0.03 m[IU]/L — ABNORMAL LOW (ref 0.40–4.50)

## 2024-09-19 LAB — T3, FREE: T3, Free: 4 pg/mL (ref 2.3–4.2)

## 2024-09-19 NOTE — Progress Notes (Unsigned)
 Name: [name] Huff  MRN/ DOB: [id number], May 29, 1946    Age/ Sex: 78 y.o., male     PCP: Milon Cleaves, [place]   Reason for Endocrinology Evaluation: Hyperthyroidism     Initial Endocrinology Clinic Visit: 05/16/2019    PATIENT IDENTIFIER: [name] is a 78 y.o., male with a past medical history of HTN. He has followed with [facility] since 05/16/2019 for consultative assistance with management of his Hyperthyroidism      HISTORICAL SUMMARY: The patient was first diagnosed with hypothyroidism many years ago, he was  on LT-4 replacement for many years up to 150 mcg daily,  but in 2019 his dose has been gradually reduced and was off for a few months prior to his presentation to our clinic.    He was diagnosed with hyperthyroidism secondary to graves' disease in 05/2019 and started on Methimazole    Thyroid  ultrasound at [facility] in February, 2023 revealed a solitary left thyroid  nodule at 2.6 cm, meeting FNA criteria.  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  Patient continues to follow-up with urology for suspected metastatic prostate cancer \\Has  been on Lasix  for lower extremity edema Patient presented with AKI secondary to obstruction in the setting of suspected metastatic prostate cancer in 8, 2025  Patient has been noted weight loss since his last visit here No local neck swelling  No palpitations  No tremors  No constipation or diarrhea   Methimazole  5 mg, 2 tab Thursday, Friday, Saturday, and Sunday, 1 tablet Monday through Wednesday   HISTORY:  Past Medical History:  Past Medical History:  Diagnosis Date   Asthma    GERD (gastroesophageal reflux disease)    Hyperlipidemia     Hypertension    Skin cancer 2024   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 He has never used smokeless tobacco. He reports that he does not drink alcohol and does not use drugs. Family History:  Family History  Problem Relation Age of Onset   Thyroid  disease Sister    Goiter Paternal Grandmother    Heart attack Mother    Colon cancer Father        Patient passed away unknown     HOME MEDICATIONS: Allergies as of 09/19/2024   No Known Allergies      Medication List        Accurate as of September 19, 2024 11:47 AM. If you have any questions, ask your nurse or doctor.          STOP taking these medications    fluticasone  50 MCG/ACT nasal spray Commonly known as: FLONASE  Stopped by: Donell PARAS Ishani Goldwasser   lisinopril  10 MG tablet Commonly known as: ZESTRIL  Stopped by: Donell PARAS Kunio Cummiskey       TAKE these medications    bicalutamide 50 MG tablet Commonly known as: CASODEX Take 50 mg by mouth daily.   Eucerin Eczema Relief 1 % Crea Generic drug: Colloidal Oatmeal Apply topically.   fexofenadine 180 MG tablet Commonly known  as: ALLEGRA Take 180 mg by mouth daily.   finasteride 5 MG tablet Commonly known as: PROSCAR Take 5 mg by mouth daily.   furosemide  20 MG tablet Commonly known as: LASIX  Take 20 mg by mouth 2 (two) times daily.   methimazole  5 MG tablet Commonly known as: TAPAZOLE  Take 1 tablet (5 mg total) by mouth as directed. 1 tablet Monday through Wednesday  , 2 tabs Thursday, Friday, Saturday and Sundays   omeprazole  40 MG capsule Commonly known as: PRILOSEC TAKE 1 CAPSULE BY MOUTH EVERY DAY   polyethylene glycol powder 17 GM/SCOOP powder Commonly known as: GLYCOLAX/MIRALAX Take 17 g by mouth 2 (two)  times daily.   triamcinolone  ointment 0.5 % Commonly known as: KENALOG  Apply 1 Application topically 2 (two) times daily.          OBJECTIVE:   PHYSICAL EXAM: VS: BP (!) 164/84   Ht 5' 7 (1.702 m)   Wt 175 lb (79.4 kg)   BMI 27.41 kg/m    EXAM: General: Pt appears well and is in NAD  Neck: General: Supple without adenopathy. Thyroid : Unable to appreciate nodules on exam today  Lungs: Clear with good BS bilat   Heart: Auscultation: RRR.  Abdomen: soft, nontender  Extremities:  BL LE: 1+ non pitting edema    Mental Status: Judgment, insight: Intact Orientation: Oriented to time, place, and person Mood and affect: No depression, anxiety, or agitation     DATA REVIEWED:   Latest Reference Range & Units 04/18/24 11:54  TSH 0.40 - 4.50 mIU/L 0.24 (L)  T4,Free(Direct) 0.8 - 1.8 ng/dL 1.1    Ref. Range 05/16/2019 15:04  TRAB Latest Ref Range: <=2.00 IU/L >40.00 (H)     Thyroid  ultrasound 05/09/2024       ASSESSMENT / PLAN / RECOMMENDATIONS:   Hyperthyroidism Secondary To Graves' Disease:   -Pt with H[place]-Toxi given his prior diagnosis of hypothyroidism  - Patient is clinically euthyroid - No local neck symptoms - Patient was recently hospitalized for hematuria, he believes he may have missed a couple days of methimazole , he has been back consistently on it since the discharge   Medications  methimazole  5 mg, 1 tablet Monday through Wednesday  , 2 tabs on Thursday, Friday, Saturday and Sundays     2. Graves' Disease:    - No extra-thyroidal manifestations of graves' disease   3.  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 [place] [place], [place] [address] Phone: [phone] FAX: [phone]      CC: Milon Cleaves, PA [address] [place] [address] Phone: [phone]  Fax: [phone]   Return to Endocrinology clinic as below: Future Appointments  Date Time Provider Department Center  09/19/2024 11:50 AM Daquon Greenleaf, Donell Redgie, MD [facility] None  10/24/2024 10:00 AM Milon Cleaves, PA [facility]

## 2024-09-20 ENCOUNTER — Ambulatory Visit: Payer: Self-pay | Admitting: Internal Medicine

## 2024-09-20 MED ORDER — METHIMAZOLE 5 MG PO TABS
10.0000 mg | ORAL_TABLET | Freq: Every day | ORAL | 2 refills | Status: AC
Start: 2024-09-20 — End: ?

## 2024-09-20 MED ORDER — METHIMAZOLE 5 MG PO TABS: 10.0000 mg | ORAL_TABLET | Freq: Every day | ORAL | 2 refills | Status: DC

## 2024-09-20 NOTE — Telephone Encounter (Signed)
 Spoke with patient regarding his results.  Gave patient the new directions.  He will follow up with his pcp.

## 2024-09-20 NOTE — Telephone Encounter (Signed)
 Please let the patient know that his thyroid  test results continue to show overactivity of the thyroid .   I understand that he missed some doses during hospitalization.  But I would suggest that he needs to increase methimazole  to taking TWO tablets daily from now on Monday all the way through Sunday  If he is able to have his labs rechecked at his PCPs office in 2 months, that would be great  Thanks

## 2024-09-20 NOTE — Telephone Encounter (Signed)
-----   Message from Donell Redgie Butts, MD sent at 09/20/2024 11:04 AM EST -----   ----- Message ----- From: Rebecka Hose Lab Results In Sent: 09/19/2024  11:17 PM EST To: Donell Redgie Butts, MD

## 2024-09-21 ENCOUNTER — Other Ambulatory Visit: Payer: Self-pay | Admitting: Physician Assistant

## 2024-10-03 ENCOUNTER — Ambulatory Visit: Admitting: Physician Assistant

## 2024-10-03 ENCOUNTER — Ambulatory Visit (HOSPITAL_BASED_OUTPATIENT_CLINIC_OR_DEPARTMENT_OTHER)
Admission: RE | Admit: 2024-10-03 | Discharge: 2024-10-03 | Disposition: A | Discharge status: Home / Self Care | Source: Ambulatory Visit | Attending: Urology | Admitting: Urology

## 2024-10-03 DIAGNOSIS — C61 Malignant neoplasm of prostate: Secondary | ICD-10-CM

## 2024-10-03 MED ORDER — FLOTUFOLASTAT F 18 GALLIUM 296-5846 MBQ/ML IV SOLN
7.8800 | Freq: Once | INTRAVENOUS | Status: AC
  Administered 2024-10-03: 7.57 via INTRAVENOUS

## 2024-10-11 ENCOUNTER — Ambulatory Visit: Admitting: Physician Assistant

## 2024-10-18 ENCOUNTER — Ambulatory Visit: Admitting: Internal Medicine

## 2024-10-23 NOTE — Progress Notes (Unsigned)
 [facility] at [facility] [address] [place],  [place]  [address] [phone]  Clinic Day:  10/24/2024  Referring physician: Milon Cleaves, PA   HISTORY OF PRESENT ILLNESS:  The patient is a 78 y.o. male  who I was asked to consult upon for metastatic prostate cancer.  His history dates back to late August 2025 when he woke up 1 morning with gross hematuria.  He went to the emergency room, for which he was seen and discharged.  Nevertheless, as his gross hematuria persisted for nearly a week, he presented back to the emergency room for further evaluation.  As his hemoglobin was low, he was given a blood transfusion.  Furthermore, he was transferred to a local tertiary care hospital for further evaluation.  The patient initially had prostate biopsies done, which came back inconclusive.  However, his PSA level in August 2025 came back very elevated at 2983.  Repeat prostate biopsies were eventually done, which showed multiple areas of Gleason grade 8 and 9 prostate cancer.  Based upon his prostate cancer findings and his very elevated PSA level, he did undergo a PSMA PET scan in November 2025, which showed extensive osseous metastasis.  There was also evidence of nodal metastasis in his left obturator space.  Just recently, he was seen by urology locally, who placed him on Firmagon/bicalutamide.  When his PSA was rechecked in November 2025, it was much better at 14.4.  Nevertheless, he comes in today to discuss his entire clinical picture to determine if there is anything else which needs to be done for his metastatic prostate cancer.  Of note, since his prostate  cancer therapy was started, he no longer has issues with gross hematuria.  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     apalutamide  (ERLEADA ) 60 MG tablet Take 4 tablets (240 mg total) by mouth daily. 4 tablet 120   Colloidal Oatmeal (EUCERIN ECZEMA RELIEF) 1 % CREA Apply topically.     fexofenadine (ALLEGRA) 180 MG tablet Take 180 mg by mouth daily.     finasteride (PROSCAR) 5 MG tablet Take 5 mg by mouth daily.     methimazole  (TAPAZOLE ) 5 MG tablet Take 2 tablets (10 mg total) by mouth daily. Take 2 tablets Monday through sunday 180 tablet 2   omeprazole  (PRILOSEC) 40 MG capsule TAKE 1 CAPSULE BY MOUTH EVERY DAY 90 capsule 1   polyethylene glycol powder (GLYCOLAX/MIRALAX) 17 GM/SCOOP powder Take 17 g by mouth 2 (two) times daily.     triamcinolone  ointment (KENALOG ) 0.5 % Apply 1 Application topically 2 (two) times daily. 30 g 0   No current facility-administered medications for this visit.    ALLERGIES:  No Known Allergies  FAMILY HISTORY:   Family History  Problem Relation  Age of Onset   Colon cancer Father    Breast cancer Sister    Cancer - Other Sister        UNK PRIMARY   Dementia Sister    Prostate cancer Brother    Prostate cancer Brother     SOCIAL HISTORY:  The patient was born and raised in [place].  He currently lives in [place].  He is divorced, with no children.  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  He also drove a school bus for 19 years.  He has a history of smoking in the remote past.  He denies a history of alcohol abuse.  REVIEW OF SYSTEMS:  Review of Systems  Constitutional:  Negative for fatigue, fever and  unexpected weight change.  Respiratory:  Negative for chest tightness, cough, hemoptysis and shortness of breath.   Cardiovascular:  Negative for chest pain and palpitations.  Gastrointestinal:  Negative for abdominal distention, abdominal pain, blood in stool, constipation, diarrhea, nausea and vomiting.  Genitourinary:  Negative for dysuria, frequency and hematuria.   Musculoskeletal:  Negative for arthralgias, back pain and myalgias.  Skin:  Negative for itching and rash.  Neurological:  Negative for dizziness, headaches and light-headedness.  Psychiatric/Behavioral:  Negative for depression and suicidal ideas. The patient is not nervous/anxious.      PHYSICAL EXAM:  Blood pressure (!) 155/68, pulse 78, temperature 98.7 F (37.1 C), temperature source Oral, resp. rate 16, height 5' 7 (1.702 m), weight 180 lb 3.2 oz (81.7 kg), SpO2 99%. Wt Readings from Last 3 Encounters:  10/24/24 180 lb 3.2 oz (81.7 kg)  09/19/24 175 lb (79.4 kg)  08/03/24 174 lb 12.8 oz (79.3 kg)   Body mass index is 28.22 kg/m. Performance status (ECOG): 1 - Symptomatic but completely ambulatory Physical Exam Constitutional:      Appearance: Normal appearance. He is not ill-appearing.  HENT:     Mouth/Throat:     Mouth: Mucous membranes are moist.     Pharynx: Oropharynx is clear. No oropharyngeal exudate or posterior oropharyngeal erythema.  Cardiovascular:     Rate and Rhythm: Normal rate and regular rhythm.     Heart sounds: No murmur heard.    No friction rub. No gallop.  Pulmonary:     Effort: Pulmonary effort is normal. No respiratory distress.     Breath sounds: Normal breath sounds. No wheezing, rhonchi or rales.  Abdominal:     General: Bowel sounds are normal. There is no distension.     Palpations: Abdomen is soft. There is no mass.     Tenderness: There is no abdominal tenderness.  Musculoskeletal:        General: No swelling.     Right lower leg: No edema.     Left lower leg: No edema.   Lymphadenopathy:     Cervical: No cervical adenopathy.     Upper Body:     Right upper body: No supraclavicular or axillary adenopathy.     Left upper body: No supraclavicular or axillary adenopathy.     Lower Body: No right inguinal adenopathy. No left inguinal adenopathy.  Skin:    General: Skin is warm.     Coloration: Skin is not jaundiced.     Findings: No lesion or rash.  Neurological:     General: No focal deficit present.     Mental Status: He is alert and oriented to person, place, and time. Mental status is at baseline.  Psychiatric:  Mood and Affect: Mood normal.        Behavior: Behavior normal.        Thought Content: Thought content normal.     LABS:      Latest Ref Rng & Units 10/24/2024    2:03 PM 03/27/2023    8:44 AM 05/15/2021    1:57 PM  CBC  WBC 4.0 - 10.5 K/uL 8.3  8.7  8.8   Hemoglobin 13.0 - 17.0 g/dL 88.8  82.9  83.6   Hematocrit 39.0 - 52.0 % 36.5  50.6  48.2   Platelets 150 - 400 K/uL 326  287  281       Latest Ref Rng & Units 10/24/2024    2:03 PM 03/27/2023    8:44 AM 05/15/2021    1:57 PM  CMP  Glucose 70 - 99 mg/dL 897  91  94   BUN 8 - 23 mg/dL 14  9  11    Creatinine 0.61 - 1.24 mg/dL 9.04  8.94  9.00   Sodium 135 - 145 mmol/L 137  138  133   Potassium 3.5 - 5.1 mmol/L 3.6  4.5  4.2   Chloride 98 - 111 mmol/L 101  101  97   CO2 22 - 32 mmol/L 26  21  22    Calcium 8.9 - 10.3 mg/dL 9.8  9.5  9.5   Total Protein 6.5 - 8.1 g/dL 7.4  7.0  7.4   Total Bilirubin 0.0 - 1.2 mg/dL 0.2  0.5  0.4   Alkaline Phos 38 - 126 U/L 132  102  105   AST 15 - 41 U/L 21  17  17    ALT 0 - 44 U/L 10  16  18     PATHOLOGY: A. 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 Percentage of Gleason pattern 4: 60%. Percentage of Gleason pattern 5: 5%.  B. RIGHT PARAMEDIAN APEX, PROSTATE, NEEDLE CORE BIOPSY: Acinar adenocarcinoma, Gleason 3+4=7  (Grade Group 2), in 1 of 1 core, involving 50% of the core length.  Percentage of Gleason pattern 4: 25%.  C. LEFT PARAMEDIAN BASE, PROSTATE, NEEDLE CORE BIOPSY: Benign prostatic tissue. Negative for malignancy.   D. 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 Percentage of Gleason pattern 4: 90%. Percentage of Gleason pattern 5: 5%.   E. LEFT POSTERIOR APEX, PROSTATE, NEEDLE CORE BIOPSY: Acinar adenocarcinoma, Gleason 3+5=8 (Grade Group 4), in 1 of 1 core, involving 90% of the core length.  Percentage of Gleason pattern 4: 20%. Percentage of Gleason pattern 5: 10%.   F. 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 Percentage of Gleason pattern 4: 80%. Percentage of Gleason pattern 5: 20%. Perineural invasion is present.   G. 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  High grade prostatic intraepithelial neoplasia. Percentage of Gleason pattern 4: 90%. Percentage of Gleason pattern 5: 5%.   H. 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  High grade prostatic intraepithelial neoplasia. Percentage of Gleason  pattern 4: 60%. Percentage of Gleason pattern 5: 30%.   I. 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 Percentage of Gleason pattern 4: 40%. Percentage of Gleason pattern 5: 30%.   J. RIGHT LATERAL, PROSTATE, NEEDLE CORE BIOPSY: Acinar adenocarcinoma, Gleason 4+5=9 (Grade Group 5), in 1 of 1 core, involving 70%  of the core length. High grade prostatic intraepithelial neoplasia. Percentage of Gleason pattern 4: 80%. Percentage of Gleason pattern 5: 10%.   K. 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 High grade prostatic intraepithelial neoplasia. Percentage of Gleason pattern 4: 70%. Percentage of Gleason pattern 5: 30%.  Perineural invasion is present.  L. RIGHT ANTERIOR, PROSTATE, NEEDLE CORE BIOPSY: Acinar adenocarcinoma, Gleason 3+4=7 (Grade Group 2), in 1 of 1 core, involving 10% of the core length. Percentage of Gleason pattern 4: 40%.    STUDIES:  NM PET (PSMA) SKULL TO MID THIGH Result Date: 10/07/2024 EXAM: PROSTATE PET SKULL BASE TO MID THIGHS 10/03/2024 11:57:00 AM TECHNIQUE: RADIOPHARMACEUTICAL: 7.57 mCi F-18 flotufolastaf (Posluma ) injected intravenously. PET imaging was obtained from skull vertex to mid thighs. Computed tomography was used for attenuation correction and localization. Fusion imaging was obtained. COMPARISON: CT 07/03/2024. CLINICAL HISTORY: History prostate carcinoma. FINDINGS: PROSTATE AND PROSTATE BED: Intense focal activity within the left and right lobes of the prostate gland with activity ranging from 5 to 6 SUV max, consistent with multifocal primary prostate adenocarcinoma. Potential extracapsular extension along the left margin of the prostate gland (image 257). LYMPH NODES: Single PSMA avid lymph node in the left obturator space (image 237). This node measures approximately 5 mm. No PSMA avid periaortic lymph nodes. No PSMA avid mucosal nodes. BONES: There is intense multifocal PSMA avid skeletal metastases involving the proximal femurs, pelvis, entire vertebral column, ribs, sternum, shoulder girdles, and manubrium. Example lesion in the manubrium with SUV max equal 10.4 on image 102. Example lesion in the posterior left iliac bone adjacent to the SI joint  with SUV max equal to 14.4. Example lesion in the left femoral neck with SUV max equal to 10.0. Minimal CT change on the CT portion of the exam. OTHER PET FINDINGS: Physiologic activity within the salivary glands, liver, spleen, kidneys, bowel, and urinary bladder. IMPRESSION: 1. Multifocal primary prostate adenocarcinoma within the prostate gland with potential extracapsular extension along the left margin. 2. Single metastatic lymph node in the left obturator space. 3. Multifocal intensely PSMA avid lesions throughout the entire skeleton as described above. Electronically signed by: Norleen Boxer MD 10/07/2024 03:35 PM EST RP Workstation: [id number]   ASSESSMENT & PLAN:  A 78 y.o. male who I was asked to consult upon for metastatic prostate cancer.  In clinic today, I went over his biopsy results, as well as showed him his PSMA PET scan images, for which he could see he has diffuse osseous metastasis.  Based upon his bone metastasis, the patient understands that his disease is treatable, but incurable.  I am very pleased as his PSA has fallen to 14.4 after just a month of prostate cancer therapy.  However, to strengthen his complete androgen blockade therapy, I will switch him from bicalutamide to apalutamide  240 mg daily.  I will also start this gentleman on a bisphosphonate to protect his bones against a fracture from his extensive, metastatic bone disease.  Overall, the patient  clinically appears to be doing very well.  I will see him back in 6 weeks for repeat clinical assessment.  The patient understands all the plans discussed today and is in agreement with them.  I do appreciate Milon Cleaves, [place] for his new consult.   Sherril Heyward DELENA Kerns, MD

## 2024-10-24 ENCOUNTER — Inpatient Hospital Stay: Attending: Oncology

## 2024-10-24 ENCOUNTER — Other Ambulatory Visit: Payer: Self-pay

## 2024-10-24 ENCOUNTER — Telehealth: Payer: Self-pay

## 2024-10-24 ENCOUNTER — Inpatient Hospital Stay: Admitting: Oncology

## 2024-10-24 ENCOUNTER — Ambulatory Visit: Admitting: Physician Assistant

## 2024-10-24 ENCOUNTER — Other Ambulatory Visit: Payer: Self-pay | Admitting: Oncology

## 2024-10-24 ENCOUNTER — Encounter: Payer: Self-pay | Admitting: Oncology

## 2024-10-24 VITALS — BP 155/68 | HR 78 | Temp 98.7°F | Resp 16 | Ht 67.0 in | Wt 180.2 lb

## 2024-10-24 DIAGNOSIS — C779 Secondary and unspecified malignant neoplasm of lymph node, unspecified: Secondary | ICD-10-CM | POA: Diagnosis not present

## 2024-10-24 DIAGNOSIS — Z87891 Personal history of nicotine dependence: Secondary | ICD-10-CM | POA: Diagnosis not present

## 2024-10-24 DIAGNOSIS — C7951 Secondary malignant neoplasm of bone: Secondary | ICD-10-CM | POA: Insufficient documentation

## 2024-10-24 DIAGNOSIS — Z85828 Personal history of other malignant neoplasm of skin: Secondary | ICD-10-CM | POA: Insufficient documentation

## 2024-10-24 DIAGNOSIS — Z79899 Other long term (current) drug therapy: Secondary | ICD-10-CM | POA: Diagnosis not present

## 2024-10-24 DIAGNOSIS — Z800 Family history of malignant neoplasm of digestive organs: Secondary | ICD-10-CM | POA: Insufficient documentation

## 2024-10-24 DIAGNOSIS — Z803 Family history of malignant neoplasm of breast: Secondary | ICD-10-CM | POA: Insufficient documentation

## 2024-10-24 DIAGNOSIS — C61 Malignant neoplasm of prostate: Secondary | ICD-10-CM | POA: Insufficient documentation

## 2024-10-24 DIAGNOSIS — Z8042 Family history of malignant neoplasm of prostate: Secondary | ICD-10-CM | POA: Diagnosis not present

## 2024-10-24 DIAGNOSIS — Z9089 Acquired absence of other organs: Secondary | ICD-10-CM | POA: Insufficient documentation

## 2024-10-24 LAB — CBC WITH DIFFERENTIAL (CANCER CENTER ONLY)
Abs Immature Granulocytes: 0.02 K/uL (ref 0.00–0.07)
Basophils Absolute: 0.1 K/uL (ref 0.0–0.1)
Basophils Relative: 1 %
Eosinophils Absolute: 1.9 K/uL — ABNORMAL HIGH (ref 0.0–0.5)
Eosinophils Relative: 23 %
HCT: 36.5 % — ABNORMAL LOW (ref 39.0–52.0)
Hemoglobin: 11.1 g/dL — ABNORMAL LOW (ref 13.0–17.0)
Immature Granulocytes: 0 %
Lymphocytes Relative: 31 %
Lymphs Abs: 2.6 K/uL (ref 0.7–4.0)
MCH: 22.6 pg — ABNORMAL LOW (ref 26.0–34.0)
MCHC: 30.4 g/dL (ref 30.0–36.0)
MCV: 74.2 fL — ABNORMAL LOW (ref 80.0–100.0)
Monocytes Absolute: 0.5 K/uL (ref 0.1–1.0)
Monocytes Relative: 6 %
Neutro Abs: 3.3 K/uL (ref 1.7–7.7)
Neutrophils Relative %: 39 %
Platelet Count: 326 K/uL (ref 150–400)
RBC: 4.92 MIL/uL (ref 4.22–5.81)
RDW: 16.7 % — ABNORMAL HIGH (ref 11.5–15.5)
WBC Count: 8.3 K/uL (ref 4.0–10.5)
nRBC: 0 % (ref 0.0–0.2)

## 2024-10-24 LAB — CMP (CANCER CENTER ONLY)
ALT: 10 U/L (ref 0–44)
AST: 21 U/L (ref 15–41)
Albumin: 4.2 g/dL (ref 3.5–5.0)
Alkaline Phosphatase: 132 U/L — ABNORMAL HIGH (ref 38–126)
Anion gap: 10 (ref 5–15)
BUN: 14 mg/dL (ref 8–23)
CO2: 26 mmol/L (ref 22–32)
Calcium: 9.8 mg/dL (ref 8.9–10.3)
Chloride: 101 mmol/L (ref 98–111)
Creatinine: 0.95 mg/dL (ref 0.61–1.24)
GFR, Estimated: >60 mL/min (ref >60)
Glucose, Bld: 102 mg/dL — ABNORMAL HIGH (ref 70–99)
Potassium: 3.6 mmol/L (ref 3.5–5.1)
Sodium: 137 mmol/L (ref 135–145)
Total Bilirubin: 0.2 mg/dL (ref 0.0–1.2)
Total Protein: 7.4 g/dL (ref 6.5–8.1)

## 2024-10-24 LAB — PSA: Prostatic Specific Antigen: 5.67 ng/mL — ABNORMAL HIGH (ref 0.00–4.00)

## 2024-10-24 MED ORDER — APALUTAMIDE 60 MG PO TABS: 240.0000 mg | ORAL_TABLET | Freq: Every day | ORAL | Status: DC

## 2024-10-24 NOTE — Telephone Encounter (Signed)
 Oral Oncology Pharmacist Encounter  Received new prescription for Erleada  (apalutamide ) for the treatment of metastatic hormone sensitive prostate cancer in conjunction with Firmagon, planned duration until disease progression or unacceptable toxicity.  Labs from 10/24/2024 assessed, no interventions needed. Prescription dose and frequency assessed for appropriateness. Script was only sent for 4 pills and will need to be corrected.   Current medication list in Epic reviewed, DDIs with Erleada  identified: - omeprazole  (cat X): will see what patient is using the medication for and discuss with MD to recommend a discontinuation of the omeprazole . It is recommended to avoid the combination because a strong CYP2C19 inducers may decrease the concentration of omeprazole .  - Fexofenadine (cat C): P-gp inducers may decrease the concentration of fexofenadine. Minor interaction and will notify patient to monitor for reduced fexofenadine efficacy.   Evaluated chart and no patient barriers to medication adherence noted.   Patient agreement for treatment documented in MD note on 10/24/2024.  Prescription has been e-scribed to the [facility] for benefits analysis and approval.  Oral Oncology Clinic will continue to follow for insurance authorization, copayment issues, initial counseling and start date.  Tymeir Weathington, PharmD Hematology/Oncology Clinical Pharmacist [place] [facility] [phone] 10/24/2024 3:42 PM

## 2024-10-25 ENCOUNTER — Telehealth: Payer: Self-pay

## 2024-10-25 ENCOUNTER — Other Ambulatory Visit (HOSPITAL_COMMUNITY): Payer: Self-pay

## 2024-10-25 NOTE — Telephone Encounter (Signed)
 Oral Oncology Patient Advocate Encounter   Received notification that prior authorization for apalutamide  (ERLEADA ) 60 MG tablet  is required.   PA submitted on 10/25/24 Key [id number] Status is pending     Lucie Lamer, CPhT [place]  [facility] Oncology Pharmacy Patient Advocate Specialist [facility] [place] Phone: [phone]  Fax: [phone] [email or web address]

## 2024-10-25 NOTE — Telephone Encounter (Signed)
 Oral Oncology Patient Advocate Encounter  Prior Authorization for apalutamide  (ERLEADA ) 60 MG tablet  has been approved.    PA# [id number] Effective dates: 10/25/24 through 11/09/25  Patients co-pay is $1,758.57.    Lucie Lamer, CPhT [place]  [facility] Oncology Pharmacy Patient Advocate Specialist [facility] [place] Phone: [phone]  Fax: [phone] [email or web address]

## 2024-10-26 ENCOUNTER — Telehealth: Payer: Self-pay

## 2024-10-26 NOTE — Telephone Encounter (Signed)
 Oral Oncology Patient Advocate Encounter  Left message on his voicemail: Copay is over $1700, there are currently no grants opened and [employer] Patient Assistance is not currently taking any new applications until January of 2026. I told him to continue taking his currently medication and once we have an update we would let him know.  Lucie Lamer, CPhT [place]  [facility] Oncology Pharmacy Patient Advocate Specialist [facility] [place] Phone: [phone]  Fax: [phone] [email or web address]

## 2024-10-27 ENCOUNTER — Encounter: Payer: Self-pay | Admitting: Physician Assistant

## 2024-10-27 ENCOUNTER — Telehealth: Payer: Self-pay

## 2024-10-27 ENCOUNTER — Ambulatory Visit: Admitting: Physician Assistant

## 2024-10-27 ENCOUNTER — Other Ambulatory Visit: Payer: Self-pay | Admitting: Oncology

## 2024-10-27 VITALS — BP 146/78 | HR 72 | Temp 97.8°F | Ht 67.0 in | Wt 180.6 lb

## 2024-10-27 DIAGNOSIS — C61 Malignant neoplasm of prostate: Secondary | ICD-10-CM | POA: Diagnosis not present

## 2024-10-27 DIAGNOSIS — E0500 Thyrotoxicosis with diffuse goiter without thyrotoxic crisis or storm: Secondary | ICD-10-CM

## 2024-10-27 DIAGNOSIS — E0590 Thyrotoxicosis, unspecified without thyrotoxic crisis or storm: Secondary | ICD-10-CM | POA: Diagnosis not present

## 2024-10-27 DIAGNOSIS — I10 Essential (primary) hypertension: Secondary | ICD-10-CM | POA: Diagnosis not present

## 2024-10-27 DIAGNOSIS — C7951 Secondary malignant neoplasm of bone: Secondary | ICD-10-CM

## 2024-10-27 DIAGNOSIS — L209 Atopic dermatitis, unspecified: Secondary | ICD-10-CM | POA: Diagnosis not present

## 2024-10-27 DIAGNOSIS — E782 Mixed hyperlipidemia: Secondary | ICD-10-CM

## 2024-10-27 MED ORDER — ENZALUTAMIDE 40 MG PO CAPS: 160.0000 mg | ORAL_CAPSULE | Freq: Every day | ORAL | 5 refills | Status: DC

## 2024-10-27 NOTE — Telephone Encounter (Signed)
 ADDENDUM: We are changing medications to xtandi  at this time after discussion with MD as the Erleada  patient assistance program is no longer receiving new PAP forms until January 2026. MD would like us  to continue with the change to Xtandi .  Oral Oncology Pharmacist Encounter  Received new prescription for Xtandi  (enzalutamide ) for the treatment of metastatic hormone sensitive prostate cancer in conjunction with Firmagon, planned duration until disease progression or unacceptable toxicity.  Labs from 10/24/2024 (CBC and CMP) assessed, no interventions needed. Prescription has not been entered.   Current medication list in Epic reviewed, DDIs with Xtandi  identified: - omeprazole  (Cat C): CYP2C19 moderate inducers may decrease the concentration of omeprazole . Will have patient monitor for decreased effectiveness.   Evaluated chart and no patient barriers to medication adherence noted.   Prescription has been e-scribed to the [facility] for benefits analysis and approval.  Oral Oncology Clinic will continue to follow for insurance authorization, copayment issues, initial counseling and start date.  [name] Huff, PharmD Hematology/Oncology Clinical Pharmacist [place] [facility] [phone] 10/27/2024 9:41 AM

## 2024-10-27 NOTE — Assessment & Plan Note (Addendum)
 Managed with methimazole , increased to 15 mg three times a week. Reports no symptoms of tachycardia. Stable kidney and liver function. - Continue methimazole  15 mg three times a week. - Ordered thyroid  function tests to be drawn today and sent to endocrinologist.

## 2024-10-27 NOTE — Assessment & Plan Note (Addendum)
 Controlled Continue to monitor at home Will adjust treatment based on readings

## 2024-10-27 NOTE — Telephone Encounter (Signed)
 Oral Oncology Patient Advocate Encounter   Received notification that prior authorization for Xtandi  40mg  is required.   PA submitted on 10/27/24 Key [id number] Status is pending     Lucie Lamer, CPhT [place]  [facility] Oncology Pharmacy Patient Advocate Specialist II [facility] [place] Phone: [phone]  Fax: [phone] [email or web address]

## 2024-10-27 NOTE — Assessment & Plan Note (Addendum)
 Monitored. Weight gain considered beneficial after previous weight loss. - Ordered cholesterol panel to be drawn today and sent to endocrinologist. Orders:   Lipid panel

## 2024-10-27 NOTE — Assessment & Plan Note (Addendum)
 Hyperthyroidism Managed with methimazole , increased to 15 mg three times a week. Reports no symptoms of tachycardia. Stable kidney and liver function. - Continue methimazole  15 mg three times a week. - Ordered thyroid  function tests to be drawn today and sent to endocrinologist. Orders:   TSH   T4, free   T3, free   Hemoglobin A1c

## 2024-10-27 NOTE — Progress Notes (Signed)
 "  Subjective:  Patient ID: [name] Huff, male    DOB: 09-03-1946  Age: 78 y.o. MRN: [id number]  Chief Complaint  Patient presents with   Medical Management of Chronic Issues    HPI: Discussed the use of AI scribe software for clinical note transcription with the patient, who gave verbal consent to proceed.  History of Present Illness [name] Huff is a 78 year old male with prostate cancer who presents with persistent itching and follow-up on prostate cancer treatment.  He has persistent itching, primarily on his back, which has been ongoing since his hospitalization. He has a history of eczema since infancy, previously treated with penicillin shots. The itching may be related to his current medications or hormone shots for prostate cancer. He uses a prescribed cream, which helps, but struggles to apply it to his back.  His prostate cancer is being managed with bicalutamide and hormone shots. PSA levels have decreased significantly from 2900 to 14. He experiences occasional microhematuria. He receives monthly shots for bone health due to metastasis to the bone.  He is taking methimazole  for thyroid  management, with an increased dose of 15 mg three times a week. He feels well without symptoms of heart racing.  He mentions a recent weight gain of six pounds, attributed to his eating habits. He has a history of getting up twice a night to urinate, consistent even before his current medical issues.  He recalls a significant medical history from infancy, where he received numerous penicillin shots for eczema, which was life-threatening at the time.          10/24/2024    4:10 PM 08/03/2024    9:41 AM 06/25/2023   11:21 AM 03/27/2023    8:15 AM 11/25/2021    3:31 PM  Depression screen PHQ 2/9  Decreased Interest 0 0 0 0 0  Down, Depressed, Hopeless 0 0 0 0 0  PHQ - 2 Score 0 0 0 0 0  Altered sleeping    0   Tired, decreased energy    0   Change in appetite    0   Feeling bad  or failure about yourself     0   Trouble concentrating    0   Moving slowly or fidgety/restless    0   Suicidal thoughts    0   PHQ-9 Score    0    Difficult doing work/chores    Not difficult at all      Data saved with a previous flowsheet row definition        06/25/2023   11:22 AM  Fall Risk   Falls in the past year? 0  Number falls in past yr: 0  Injury with Fall? 0   Risk for fall due to : No Fall Risks  Follow up Falls prevention discussed     Data saved with a previous flowsheet row definition    Patient Care Team: Milon Cleaves, [place] as PCP - General (Physician Assistant) Shamleffer, Donell Cardinal, MD as Attending Physician (Endocrinology) Marda General, MD as Consulting Physician (Urology)   Review of Systems  Constitutional:  Negative for appetite change, fatigue and fever.  HENT:  Negative for congestion, ear pain, sinus pressure and sore throat.   Respiratory:  Negative for cough, chest tightness, shortness of breath and wheezing.   Cardiovascular:  Negative for chest pain and palpitations.  Gastrointestinal:  Negative for abdominal pain, constipation, diarrhea, nausea and vomiting.  Genitourinary:  Negative for  dysuria and hematuria.  Musculoskeletal:  Negative for arthralgias, back pain, joint swelling and myalgias.  Skin:  Negative for rash.       Skin is itchy and dry.  Neurological:  Negative for dizziness, weakness and headaches.  Psychiatric/Behavioral:  Negative for dysphoric mood. The patient is not nervous/anxious.     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   CATARACT EXTRACTION      COLONOSCOPY W/ POLYPECTOMY     TONSILECTOMY/ADENOIDECTOMY WITH MYRINGOTOMY  2020    Family History  Problem Relation Age of Onset   Colon cancer Father    Breast cancer Sister    Cancer - Other Sister        UNK PRIMARY   Dementia Sister    Prostate cancer Brother    Prostate cancer Brother    Social History   Socioeconomic History   Marital status: Divorced    Spouse name: Not on file   Number of children: 0   Years of education: 11   Highest education level: Not on file  Occupational History   Occupation: Environmental Education Officer: [employer]   Occupation: Retired  Tobacco Use   Smoking status: Never   Smokeless tobacco: Never  Vaping Use   Vaping status: Never Used  Substance and Sexual Activity   Alcohol use: Not Currently   Drug use: Never   Sexual activity: Not Currently    Partners: Female  Other Topics Concern   Not on file  Social History Narrative   Not on file   Social Drivers of Health   Tobacco Use: Low Risk (10/27/2024)   Patient History    Smoking Tobacco Use: Never    Smokeless Tobacco Use: Never    Passive Exposure: Not on file  Financial Resource Strain: Low Risk (06/25/2023)   Overall Financial Resource Strain (CARDIA)    Difficulty of Paying Living Expenses: Not hard at all  Food Insecurity: No Food Insecurity (10/24/2024)   Epic    Worried About Radiation Protection Practitioner of Food in the Last Year: Never true    Ran Out of Food in the Last Year: Never true  Transportation Needs: No Transportation Needs (10/24/2024)   Epic    Lack of Transportation (Medical): No    Lack of Transportation (Non-Medical): No  Physical Activity: Inactive (06/25/2023)   Exercise Vital Sign    Days of Exercise per Week: 0 days    Minutes of Exercise per Session: 0 min  Stress: No Stress Concern Present (06/25/2023)   [employer] of Occupational Health - Occupational Stress Questionnaire    Feeling of Stress : Not at all  Social Connections: Socially  Isolated (06/25/2023)   Social Connection and Isolation Panel    Frequency of Communication with Friends and Family: More than three times a week    Frequency of Social Gatherings with Friends and Family: More than three times a week    Attends Religious Services: Never    Database Administrator or Organizations: No    Attends Banker Meetings: Never  Marital Status: Divorced  Depression (PHQ2-9): Low Risk (10/24/2024)   Depression (PHQ2-9)    PHQ-2 Score: 0  Alcohol Screen: Low Risk (06/25/2023)   Alcohol Screen    Last Alcohol Screening Score (AUDIT): 0  Housing: Unknown (10/24/2024)   Epic    Unable to Pay for Housing in the Last Year: No    Number of Times Moved in the Last Year: Not on file    Homeless in the Last Year: No  Utilities: Not At Risk (10/24/2024)   Epic    Threatened with loss of utilities: No  Health Literacy: Adequate Health Literacy (06/25/2023)   B1300 Health Literacy    Frequency of need for help with medical instructions: Never    Objective:  BP (!) 146/78 (BP Location: Left Arm, Patient Position: Sitting)   Pulse 72   Temp 97.8 F (36.6 C) (Temporal)   Ht 5' 7 (1.702 m)   Wt 180 lb 9.6 oz (81.9 kg)   SpO2 100%   BMI 28.29 kg/m      10/27/2024    1:46 PM 10/27/2024    1:35 PM 10/24/2024    2:41 PM  BP/Weight  Systolic BP 146 162 155  Diastolic BP 78 82 68  Wt. (Lbs)  180.6 180.2  BMI  28.29 kg/m2 28.22 kg/m2    Physical Exam Vitals reviewed.  Constitutional:      Appearance: Normal appearance.  Neck:     Vascular: No carotid bruit.  Cardiovascular:     Rate and Rhythm: Normal rate and regular rhythm.     Heart sounds: Normal heart sounds.  Pulmonary:     Effort: Pulmonary effort is normal.     Breath sounds: Normal breath sounds.  Abdominal:     General: Bowel sounds are normal.     Palpations: Abdomen is soft.     Tenderness: There is no abdominal tenderness.  Neurological:     Mental Status: He is alert and  oriented to person, place, and time.  Psychiatric:        Mood and Affect: Mood normal.        Behavior: Behavior normal.       Lab Results  Component Value Date   WBC 8.3 10/24/2024   HGB 11.1 (L) 10/24/2024   HCT 36.5 (L) 10/24/2024   PLT 326 10/24/2024   GLUCOSE 102 (H) 10/24/2024   CHOL 178 03/27/2023   TRIG 133 03/27/2023   HDL 45 03/27/2023   LDLCALC 109 (H) 03/27/2023   ALT 10 10/24/2024   AST 21 10/24/2024   NA 137 10/24/2024   K 3.6 10/24/2024   CL 101 10/24/2024   CREATININE 0.95 10/24/2024   BUN 14 10/24/2024   CO2 26 10/24/2024   TSH 0.03 (L) 09/19/2024    Results for orders placed or performed in visit on 10/24/24  PSA   Collection Time: 10/24/24  2:02 PM  Result Value Ref Range   Prostatic Specific Antigen 5.67 (H) 0.00 - 4.00 ng/mL  CMP (Cancer Center only)   Collection Time: 10/24/24  2:03 PM  Result Value Ref Range   Sodium 137 135 - 145 mmol/L   Potassium 3.6 3.5 - 5.1 mmol/L   Chloride 101 98 - 111 mmol/L   CO2 26 22 - 32 mmol/L   Glucose, Bld 102 (H) 70 - 99 mg/dL   BUN 14 8 - 23 mg/dL   Creatinine 9.04 9.38 - 1.24 mg/dL   Calcium 9.8 8.9 - 89.6 mg/dL   Total  Protein 7.4 6.5 - 8.1 g/dL   Albumin 4.2 3.5 - 5.0 g/dL   AST 21 15 - 41 U/L   ALT 10 0 - 44 U/L   Alkaline Phosphatase 132 (H) 38 - 126 U/L   Total Bilirubin 0.2 0.0 - 1.2 mg/dL   GFR, Estimated >39 >39 mL/min   Anion gap 10 5 - 15  CBC with Differential (Cancer Center Only)   Collection Time: 10/24/24  2:03 PM  Result Value Ref Range   WBC Count 8.3 4.0 - 10.5 K/uL   RBC 4.92 4.22 - 5.81 MIL/uL   Hemoglobin 11.1 (L) 13.0 - 17.0 g/dL   HCT 63.4 (L) 60.9 - 47.9 %   MCV 74.2 (L) 80.0 - 100.0 fL   MCH 22.6 (L) 26.0 - 34.0 pg   MCHC 30.4 30.0 - 36.0 g/dL   RDW 83.2 (H) 88.4 - 84.4 %   Platelet Count 326 150 - 400 K/uL   nRBC 0.0 0.0 - 0.2 %   Neutrophils Relative % 39 %   Neutro Abs 3.3 1.7 - 7.7 K/uL   Lymphocytes Relative 31 %   Lymphs Abs 2.6 0.7 - 4.0 K/uL   Monocytes  Relative 6 %   Monocytes Absolute 0.5 0.1 - 1.0 K/uL   Eosinophils Relative 23 %   Eosinophils Absolute 1.9 (H) 0.0 - 0.5 K/uL   Basophils Relative 1 %   Basophils Absolute 0.1 0.0 - 0.1 K/uL   Immature Granulocytes 0 %   Abs Immature Granulocytes 0.02 0.00 - 0.07 K/uL  .  Assessment & Plan:   Assessment & Plan Mixed hyperlipidemia Monitored. Weight gain considered beneficial after previous weight loss. - Ordered cholesterol panel to be drawn today and sent to endocrinologist. Orders:   Lipid panel  Primary hypertension Controlled Continue to monitor at home Will adjust treatment based on readings    Graves disease Managed with methimazole , increased to 15 mg three times a week. Reports no symptoms of tachycardia. Stable kidney and liver function. - Continue methimazole  15 mg three times a week. - Ordered thyroid  function tests to be drawn today and sent to endocrinologist.    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 PSA decreased from 2900 to 14. Treatable but not curable. Side effects include injection site reactions and itching. Awaiting insurance approval for new medication due to high copay. - Continue current hormone therapy regimen. - Monitor PSA levels regularly. - Coordinate with oncologist for potential new medication once insurance approval is obtained. - Administer monthly bone health injections.    Hyperthyroidism Hyperthyroidism Managed with methimazole , increased to 15 mg three times a week. Reports no symptoms of tachycardia. Stable kidney and liver function. - Continue methimazole  15 mg three times a week. - Ordered thyroid  function tests to be drawn today and sent to endocrinologist. Orders:   TSH   T4, free   T3, free   Hemoglobin A1c  Metastasis to bone (HCC) Continue to follow up with oncology    Atopic dermatitis, unspecified type Chronic pruritus and dry  skin, likely eczema Chronic pruritus and dry skin, likely eczema, primarily affecting the back. History of eczema since infancy. Difficulty applying topical cream to the back. Considering systemic treatment options pending oncologist approval. - Continue using prescribed topical cream for itching. - Try Benadryl  at night for itching relief.    General health maintenance Routine health maintenance discussed. Weight gain considered beneficial after previous weight loss. -  Ordered A1c test to be drawn today and sent to endocrinologist. - Encouraged continued healthy eating habits.  Body mass index is 28.29 kg/m.   No orders of the defined types were placed in this encounter.   Orders Placed This Encounter  Procedures   TSH   T4, free   T3, free   Hemoglobin A1c   Lipid panel       Follow-up: No follow-ups on file.  An After Visit Summary was printed and given to the patient.   I,Lauren M Auman,acting as a neurosurgeon for [employer], [employer].,have documented all relevant documentation on the behalf of [name], PA,as directed by  [name], PA while in the presence of [name], [place].    [name], [place] [facility] [phone]      [1]  Current Outpatient Medications on File Prior to Visit  Medication Sig Dispense Refill   bicalutamide (CASODEX) 50 MG tablet Take 50 mg by mouth daily.     Colloidal Oatmeal (EUCERIN ECZEMA RELIEF) 1 % CREA Apply topically.     fexofenadine (ALLEGRA) 180 MG tablet Take 180 mg by mouth daily.     methimazole  (TAPAZOLE ) 5 MG tablet Take 2 tablets (10 mg total) by mouth daily. Take 2 tablets Monday through sunday 180 tablet 2   omeprazole  (PRILOSEC) 40 MG capsule TAKE 1 CAPSULE BY MOUTH EVERY DAY 90 capsule 1   triamcinolone  ointment (KENALOG ) 0.5 % Apply 1 Application topically 2 (two) times daily. 30 g 0   No current facility-administered medications on file prior to visit.   "

## 2024-10-27 NOTE — Assessment & Plan Note (Addendum)
 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 PSA decreased from 2900 to 14. Treatable but not curable. Side effects include injection site reactions and itching. Awaiting insurance approval for new medication due to high copay. - Continue current hormone therapy regimen. - Monitor PSA levels regularly. - Coordinate with oncologist for potential new medication once insurance approval is obtained. - Administer monthly bone health injections.

## 2024-10-27 NOTE — Telephone Encounter (Signed)
 Oral Oncology Patient Advocate Encounter  Prior Authorization for Xtandi  40mg  has been approved.    PA# [id number] Effective dates: 10/27/24 through 11/09/25  Patients co-pay is $1,758.57. Applying for Patient Assistance.   Lucie Lamer, CPhT [place]  [facility] Special[facility] Pharm[facility] Services Oncology Pharmacy Patient Advocate Specialist II [facility] [place] Phone: [phone]  Fax: [phone] [email or web address]

## 2024-10-28 LAB — LIPID PANEL
Chol/HDL Ratio: 3 ratio (ref 0.0–5.0)
Cholesterol, Total: 175 mg/dL (ref 100–199)
HDL: 59 mg/dL
LDL Chol Calc (NIH): 80 mg/dL (ref 0–99)
Triglycerides: 220 mg/dL — ABNORMAL HIGH (ref 0–149)
VLDL Cholesterol Cal: 36 mg/dL (ref 5–40)

## 2024-10-28 LAB — T4, FREE: Free T4: 0.8 ng/dL — ABNORMAL LOW (ref 0.82–1.77)

## 2024-10-28 LAB — HEMOGLOBIN A1C
Est. average glucose Bld gHb Est-mCnc: 111 mg/dL
Hgb A1c MFr Bld: 5.5 % (ref 4.8–5.6)

## 2024-10-28 LAB — TSH: TSH: 0.174 u[IU]/mL — ABNORMAL LOW (ref 0.450–4.500)

## 2024-10-28 LAB — T3, FREE: T3, Free: 4.1 pg/mL (ref 2.0–4.4)

## 2024-10-31 ENCOUNTER — Ambulatory Visit: Payer: Self-pay | Admitting: Internal Medicine

## 2024-10-31 ENCOUNTER — Telehealth: Payer: Self-pay

## 2024-10-31 DIAGNOSIS — E0590 Thyrotoxicosis, unspecified without thyrotoxic crisis or storm: Secondary | ICD-10-CM

## 2024-10-31 NOTE — Telephone Encounter (Signed)
 Oral Oncology Patient Advocate Encounter  Completed application for XTANDI in an effort to reduce patient's out of pocket expense to $0.    Application completed online and submitted to [employer].   [employer] patient assistance phone number for follow up is [phone].   This encounter will be updated until final determination.    [name] Huff, CPhT [place]  [facility] Specialty Pharmacy Services Oncology Pharmacy Patient Advocate Specialist II [name] Huff Phone: [phone]  Fax: [phone] [name]

## 2024-10-31 NOTE — Telephone Encounter (Signed)
 Oral Oncology Patient Advocate Encounter  [employer] faxed in asking for the Asset Screening Questionnaire. Patient did not want to give out his financial information over the phone, so he will be coming by the office to fill out the form and sign. We will then send in once complete.  Lucie Lamer, CPhT [place]  [facility] Specialty Pharmacy Services Oncology Pharmacy Patient Advocate Specialist II [facility] [place] Phone: [phone]  Fax: [phone] [email or web address]

## 2024-11-01 ENCOUNTER — Telehealth: Payer: Self-pay

## 2024-11-01 NOTE — Telephone Encounter (Signed)
 Patient called and given results as directed by on call MD

## 2024-11-04 DIAGNOSIS — C7951 Secondary malignant neoplasm of bone: Secondary | ICD-10-CM | POA: Insufficient documentation

## 2024-11-04 NOTE — Assessment & Plan Note (Addendum)
"   Continue to follow up with oncology.   "

## 2024-11-06 DIAGNOSIS — L209 Atopic dermatitis, unspecified: Secondary | ICD-10-CM | POA: Insufficient documentation

## 2024-11-06 NOTE — Assessment & Plan Note (Addendum)
 Chronic pruritus and dry skin, likely eczema Chronic pruritus and dry skin, likely eczema, primarily affecting the back. History of eczema since infancy. Difficulty applying topical cream to the back. Considering systemic treatment options pending oncologist approval. - Continue using prescribed topical cream for itching. - Try Benadryl  at night for itching relief.

## 2024-11-07 ENCOUNTER — Other Ambulatory Visit: Payer: Self-pay | Admitting: Physician Assistant

## 2024-11-07 DIAGNOSIS — E0590 Thyrotoxicosis, unspecified without thyrotoxic crisis or storm: Secondary | ICD-10-CM

## 2024-11-09 NOTE — Telephone Encounter (Signed)
 Oral Oncology Patient Advocate Encounter   Received notification that the application for assistance for Xtandi  through [employer] has been approved.   [employer] phone number [phone].   Effective dates: 11/09/24 through 11/09/25  Medication will be filled at [facility].   Lucie Lamer, CPhT   [facility] Specialty Pharmacy Services Oncology Pharmacy Patient Advocate Specialist II [facility] [place] Phone: [phone]  Fax: [phone] [email or web address]

## 2024-11-11 ENCOUNTER — Telehealth: Payer: Self-pay

## 2024-11-11 NOTE — Telephone Encounter (Addendum)
 Pt. told he needed to come in and sign forms for pt assistance w/meds. I transferred call to [facility].  Sheppard Lamer, CPhT: He is good! I got it figured out and he has been approved!! He should be getting a call from them about setting up a delivery! He is covered to get the medication for the entire year at no cost to him.  Kaitlyn Schomburg,RPH: He should be getting a call from an [phone] number to schedule the shipment.  I attempted call to pt, no answer.

## 2024-11-16 ENCOUNTER — Telehealth: Payer: Self-pay | Admitting: Oncology

## 2024-11-16 ENCOUNTER — Telehealth: Payer: Self-pay

## 2024-11-16 NOTE — Telephone Encounter (Signed)
 I spoke with pt. He hasn't received the Xtandi  yet. He was told he should receive shipment tomorrow, 11/17/2024. He wants to start the Xtandi  on Monday, 11/21/2024. Dr Ezzard wants pt's appt's moved out to late Feb, with PSA drawn a day or 2 before f/u. I will send msg to schedulers.

## 2024-11-16 NOTE — Telephone Encounter (Signed)
 Contacted pt to schedule an appt. Unable to reach via phone, voicemail was left.   Scheduling Message Entered by [place], AMY W on 11/16/2024 at  1:05 PM Priority: High EST PT 15  Department: [facility] MED ONC  Provider: Ezzard Valaria LABOR, MD  Appointment Notes:  He wants to start the Xtandi  on Monday, 11/21/2024. Dr Ezzard wants pt's appt's moved out to late Feb, with PSA drawn a day or 2 before f/u. I will send msg to schedulers.

## 2024-11-21 NOTE — Telephone Encounter (Signed)
 Oral Chemotherapy Pharmacist Encounter  I spoke with patient for overview of: Xtandi  for the treatment of metastatic, castration- sensitive prostate cancer in conjunction with Firmagon, planned duration until disease progression or unacceptable toxicity.   Treatment goal: Control  Counseled patient on administration, dosing, side effects, monitoring, drug-food interactions, safe handling, storage, and disposal.  Patient will take Xtandi  40mg  tablets, 4 tablets (160mg  total) by mouth once daily without regard to food.  Xtandi  start date: 11/28/2024  Adverse effects include but are not limited to: peripheral edema, GI upset, hypertension, hot flashes, fatigue, falls/fractures, and arthralgias.   Patient instructed about small risk of seizures with Xtandi  treatment.  Reviewed with patient importance of keeping a medication schedule and plan for any missed doses. No barriers to medication adherence identified.  Medication reconciliation performed and medication/allergy list updated.  Distress thermometer not completed during telephone call as patient has been on previous lines of therapy.   Communication and Learning Assessment Primary learner: Patient Barriers to learning: No barriers Preferred language: English Learning preferences: Listening Reading  All questions answered. Patient voiced understanding and appreciation. Medication education handout placed in mail for patient. Patient knows to call the office with questions or concerns. Oral Chemotherapy Clinic phone number provided to patient.   Adrienne Trombetta, PharmD Hematology/Oncology Clinical Pharmacist [place] [facility] [phone] 11/21/2024   9:06 AM

## 2024-11-25 ENCOUNTER — Telehealth: Payer: Self-pay

## 2024-11-25 NOTE — Telephone Encounter (Signed)
 Xtandi  dose confirmed per script and with Dr Ezzard patient to take 160mg  daily= two 80mg  tablets a day. Detailed message left for patient.

## 2024-11-30 ENCOUNTER — Telehealth: Payer: Self-pay

## 2024-11-30 NOTE — Telephone Encounter (Signed)
 Pt didn't get to start the Xtandi  until 11/28/2024, as he didn't receive until 11/24/24. He didn't want to start over the weekend, so he waited until 11/28/2024. Pt states, So far, so good. No side effects. He is taking it about 1030 am after breakfast.

## 2024-12-02 ENCOUNTER — Inpatient Hospital Stay

## 2024-12-05 ENCOUNTER — Inpatient Hospital Stay: Admitting: Oncology

## 2024-12-06 ENCOUNTER — Telehealth: Payer: Self-pay

## 2024-12-06 NOTE — Telephone Encounter (Signed)
 Attempted call to pt to see how he has tolerated his first week on Xtandi , no answer.

## 2024-12-07 NOTE — Telephone Encounter (Signed)
 Pt returned my call. He is doing well on the Xtandi . He denies all side effects. He is taking the Xtandi  between 1015-1030a. No missed doses. States he will call if anything changes. We confirmed his next 2 appt's, for labs ,and then f/u with [facility].

## 2024-12-12 ENCOUNTER — Other Ambulatory Visit

## 2024-12-14 ENCOUNTER — Other Ambulatory Visit

## 2024-12-14 DIAGNOSIS — E0590 Thyrotoxicosis, unspecified without thyrotoxic crisis or storm: Secondary | ICD-10-CM

## 2024-12-15 LAB — THYROID PANEL WITH TSH
Free Thyroxine Index: 1.3 (ref 1.2–4.9)
T3 Uptake Ratio: 25 % (ref 24–39)
T4, Total: 5.3 ug/dL (ref 4.5–12.0)
TSH: 0.709 u[IU]/mL (ref 0.450–4.500)

## 2024-12-15 LAB — T4, FREE: Free T4: 0.79 ng/dL — ABNORMAL LOW (ref 0.82–1.77)

## 2024-12-16 ENCOUNTER — Other Ambulatory Visit: Payer: Self-pay

## 2024-12-16 ENCOUNTER — Ambulatory Visit: Payer: Self-pay | Admitting: Physician Assistant

## 2024-12-16 NOTE — Telephone Encounter (Signed)
 Patient given results and medication updates as directed by MD. No further questions at this time.

## 2025-01-02 ENCOUNTER — Inpatient Hospital Stay

## 2025-01-05 ENCOUNTER — Inpatient Hospital Stay: Admitting: Oncology

## 2025-01-20 ENCOUNTER — Ambulatory Visit: Admitting: Internal Medicine

## 2025-01-26 ENCOUNTER — Ambulatory Visit: Admitting: Physician Assistant
